# Patient Record
Sex: Female | Born: 1955
Health system: Southern US, Community
[De-identification: ages and names within clinical notes are randomized; demographics above are authoritative.]

## PROBLEM LIST (undated history)

## (undated) DIAGNOSIS — IMO0002 Reserved for concepts with insufficient information to code with codable children: Secondary | ICD-10-CM

## (undated) DIAGNOSIS — M5430 Sciatica, unspecified side: Secondary | ICD-10-CM

## (undated) HISTORY — DX: Sciatica, unspecified side: M54.30

## (undated) HISTORY — PX: AUGMENTATION MAMMAPLASTY: SUR837

## (undated) HISTORY — DX: Reserved for concepts with insufficient information to code with codable children: IMO0002

---

## 1986-07-27 HISTORY — PX: TUBAL LIGATION: SHX77

## 1998-10-03 ENCOUNTER — Other Ambulatory Visit: Admission: RE | Admit: 1998-10-03 | Discharge: 1998-10-03 | Payer: Self-pay | Admitting: Obstetrics and Gynecology

## 2000-01-16 ENCOUNTER — Encounter: Admission: RE | Admit: 2000-01-16 | Discharge: 2000-01-16 | Payer: Self-pay | Admitting: Obstetrics and Gynecology

## 2000-01-16 ENCOUNTER — Encounter: Payer: Self-pay | Admitting: Obstetrics and Gynecology

## 2001-05-09 ENCOUNTER — Other Ambulatory Visit: Admission: RE | Admit: 2001-05-09 | Discharge: 2001-05-09 | Payer: Self-pay | Admitting: *Deleted

## 2002-07-13 ENCOUNTER — Other Ambulatory Visit: Admission: RE | Admit: 2002-07-13 | Discharge: 2002-07-13 | Payer: Self-pay | Admitting: Obstetrics and Gynecology

## 2003-07-30 ENCOUNTER — Other Ambulatory Visit: Admission: RE | Admit: 2003-07-30 | Discharge: 2003-07-30 | Payer: Self-pay | Admitting: Obstetrics and Gynecology

## 2003-08-28 HISTORY — PX: BREAST SURGERY: SHX581

## 2004-02-22 ENCOUNTER — Encounter: Admission: RE | Admit: 2004-02-22 | Discharge: 2004-02-22 | Payer: Self-pay | Admitting: Obstetrics and Gynecology

## 2004-10-07 ENCOUNTER — Other Ambulatory Visit: Admission: RE | Admit: 2004-10-07 | Discharge: 2004-10-07 | Payer: Self-pay | Admitting: Obstetrics and Gynecology

## 2006-03-16 ENCOUNTER — Other Ambulatory Visit: Admission: RE | Admit: 2006-03-16 | Discharge: 2006-03-16 | Payer: Self-pay | Admitting: Obstetrics and Gynecology

## 2006-03-25 ENCOUNTER — Encounter: Admission: RE | Admit: 2006-03-25 | Discharge: 2006-03-25 | Payer: Self-pay | Admitting: Obstetrics and Gynecology

## 2006-04-06 ENCOUNTER — Encounter: Admission: RE | Admit: 2006-04-06 | Discharge: 2006-04-06 | Payer: Self-pay | Admitting: Obstetrics and Gynecology

## 2007-03-24 ENCOUNTER — Other Ambulatory Visit: Admission: RE | Admit: 2007-03-24 | Discharge: 2007-03-24 | Payer: Self-pay | Admitting: Obstetrics and Gynecology

## 2007-05-27 ENCOUNTER — Encounter: Admission: RE | Admit: 2007-05-27 | Discharge: 2007-05-27 | Payer: Self-pay | Admitting: Obstetrics and Gynecology

## 2007-10-21 ENCOUNTER — Encounter: Admission: RE | Admit: 2007-10-21 | Discharge: 2007-10-21 | Payer: Self-pay | Admitting: Obstetrics & Gynecology

## 2008-05-24 ENCOUNTER — Other Ambulatory Visit: Admission: RE | Admit: 2008-05-24 | Discharge: 2008-05-24 | Payer: Self-pay | Admitting: Obstetrics and Gynecology

## 2008-05-31 ENCOUNTER — Encounter: Admission: RE | Admit: 2008-05-31 | Discharge: 2008-05-31 | Payer: Self-pay | Admitting: Obstetrics and Gynecology

## 2008-06-12 ENCOUNTER — Encounter: Admission: RE | Admit: 2008-06-12 | Discharge: 2008-06-12 | Payer: Self-pay | Admitting: Obstetrics and Gynecology

## 2009-02-14 ENCOUNTER — Encounter: Admission: RE | Admit: 2009-02-14 | Discharge: 2009-02-14 | Payer: Self-pay | Admitting: Obstetrics and Gynecology

## 2009-08-20 ENCOUNTER — Encounter: Admission: RE | Admit: 2009-08-20 | Discharge: 2009-08-20 | Payer: Self-pay | Admitting: Obstetrics and Gynecology

## 2010-02-17 ENCOUNTER — Emergency Department (HOSPITAL_COMMUNITY): Admission: EM | Admit: 2010-02-17 | Discharge: 2010-02-17 | Payer: Self-pay | Admitting: Family Medicine

## 2011-11-24 DIAGNOSIS — R87619 Unspecified abnormal cytological findings in specimens from cervix uteri: Secondary | ICD-10-CM

## 2011-11-24 DIAGNOSIS — IMO0002 Reserved for concepts with insufficient information to code with codable children: Secondary | ICD-10-CM

## 2011-11-24 HISTORY — DX: Reserved for concepts with insufficient information to code with codable children: IMO0002

## 2011-11-24 HISTORY — DX: Unspecified abnormal cytological findings in specimens from cervix uteri: R87.619

## 2012-03-22 ENCOUNTER — Other Ambulatory Visit: Payer: Self-pay | Admitting: Obstetrics and Gynecology

## 2012-03-22 DIAGNOSIS — R922 Inconclusive mammogram: Secondary | ICD-10-CM

## 2012-03-25 ENCOUNTER — Ambulatory Visit
Admission: RE | Admit: 2012-03-25 | Discharge: 2012-03-25 | Disposition: A | Discharge status: Home / Self Care | Payer: BC Managed Care – PPO | Source: Ambulatory Visit | Attending: Obstetrics and Gynecology | Admitting: Obstetrics and Gynecology

## 2012-03-25 DIAGNOSIS — R9230 Dense breasts, unspecified: Secondary | ICD-10-CM

## 2012-03-25 DIAGNOSIS — R922 Inconclusive mammogram: Secondary | ICD-10-CM

## 2012-12-23 ENCOUNTER — Encounter: Payer: Self-pay | Admitting: *Deleted

## 2012-12-26 ENCOUNTER — Ambulatory Visit: Payer: Self-pay | Admitting: Obstetrics & Gynecology

## 2012-12-26 DIAGNOSIS — Z01419 Encounter for gynecological examination (general) (routine) without abnormal findings: Secondary | ICD-10-CM

## 2012-12-27 ENCOUNTER — Encounter: Payer: Self-pay | Admitting: Obstetrics & Gynecology

## 2013-01-11 ENCOUNTER — Ambulatory Visit: Payer: Self-pay | Admitting: Obstetrics & Gynecology

## 2013-01-12 ENCOUNTER — Encounter: Payer: Self-pay | Admitting: Obstetrics & Gynecology

## 2013-01-12 ENCOUNTER — Ambulatory Visit (INDEPENDENT_AMBULATORY_CARE_PROVIDER_SITE_OTHER): Payer: BC Managed Care – PPO | Admitting: Obstetrics & Gynecology

## 2013-01-12 VITALS — BP 102/64 | HR 64 | Resp 12 | Ht 67.0 in | Wt 123.2 lb

## 2013-01-12 DIAGNOSIS — Z01419 Encounter for gynecological examination (general) (routine) without abnormal findings: Secondary | ICD-10-CM

## 2013-01-12 DIAGNOSIS — Z124 Encounter for screening for malignant neoplasm of cervix: Secondary | ICD-10-CM

## 2013-01-12 DIAGNOSIS — Z0000 Encounter for general adult medical examination without abnormal findings: Secondary | ICD-10-CM

## 2013-01-12 DIAGNOSIS — R87619 Unspecified abnormal cytological findings in specimens from cervix uteri: Secondary | ICD-10-CM

## 2013-01-12 LAB — HEMOGLOBIN, FINGERSTICK: Hemoglobin, fingerstick: 12.6 g/dL (ref 12.0–16.0)

## 2013-01-12 MED ORDER — NITROFURANTOIN MACROCRYSTAL 100 MG PO CAPS: 100.0000 mg | ORAL_CAPSULE | Freq: Every day | ORAL | Status: DC

## 2013-01-12 NOTE — Patient Instructions (Signed)

## 2013-01-12 NOTE — Progress Notes (Signed)
57 y.o. Sandra Young MarriedCaucasianF here for annual exam.  Oldest daughter is getting married this fall.  Lots of stress related to this.  Daughter lives in [place].  Going to get married at [facility].  Has lost 8 pounds.  This is due to back pain and her change in exercise/diet.    Patient's last menstrual period was 03/27/2012.          Sexually active: yes  The current method of family planning is tubal ligation.    Exercising: yes  aerobics, pilates, yoga, and weights Smoker:  no  Health Maintenance: Pap:  06/28/12 ASCUS History of abnormal Pap:  Yes ASCUS/AGUS/negative HR HPV 2013, normal ECC, endometrial biopy neg MMG:  03/25/12 normal  Colonoscopy:  1/10 repeat in 10 years  BMD:   8/07 normal  TDaP:  8/08 Screening Labs: 2/12--done here, Hb today: 12.6, Urine today: WBC-trace   reports that she has never smoked. She has never used smokeless tobacco. She reports that she drinks about 0.5 ounces of alcohol per week. She reports that she does not use illicit drugs.  Past Medical History  Diagnosis Date  . Abnormal Pap smear 11/24/11    ASCUS/AGUS/negative HR HPV  . Sciatic nerve pain     Past Surgical History  Procedure Laterality Date  . Breast surgery Bilateral 08/2003    Saline implants  . Tubal ligation      Current Outpatient Prescriptions  Medication Sig Dispense Refill  . Biotin 1000 MCG tablet Take 1,000 mcg by mouth 3 (three) times daily.      Marland Kitchen BLACK COHOSH PO Take by mouth.      . Cholecalciferol (VITAMIN D) 2000 UNITS CAPS Take by mouth.      Marland Kitchen ibuprofen (ADVIL,MOTRIN) 200 MG tablet Take 200 mg by mouth every 6 (six) hours as needed for pain.      . nitrofurantoin (MACRODANTIN) 100 MG capsule Take 100 mg by mouth 4 (four) times daily. PRN       No current facility-administered medications for this visit.    Family History  Problem Relation Age of Onset  . Breast cancer Mother 29  . Cancer Father 62    Lung    ROS:  Pertinent items are noted in HPI.   Otherwise, a comprehensive ROS was negative.  Exam:   BP 102/64  Pulse 64  Resp 12  Ht 5\' 7"  (1.702 m)  Wt 123 lb 3.2 oz (55.883 kg)  BMI 19.29 kg/m2  LMP 03/27/2012  Weight change: -8lbs  Height: 5\' 7"  (170.2 cm)  Ht Readings from Last 3 Encounters:  01/12/13 5\' 7"  (1.702 m)    General appearance: alert, cooperative and appears stated age Head: Normocephalic, without obvious abnormality, atraumatic Neck: no adenopathy, supple, symmetrical, trachea midline and thyroid normal to inspection and palpation Lungs: clear to auscultation bilaterally Breasts: normal appearance, no masses or tenderness, bilateral and very mobile implants Heart: regular rate and rhythm Abdomen: soft, non-tender; bowel sounds normal; no masses,  no organomegaly Extremities: extremities normal, atraumatic, no cyanosis or edema Skin: Skin color, texture, turgor normal. No rashes or lesions Lymph nodes: Cervical, supraclavicular, and axillary nodes normal. No abnormal inguinal nodes palpated Neurologic: Grossly normal   Pelvic: External genitalia:  no lesions              Urethra:  normal appearing urethra with no masses, tenderness or lesions              Bartholins and Skenes: normal  Vagina: normal appearing vagina with normal color and discharge, no lesions              Cervix: no lesions              Pap taken: yes Bimanual Exam:  Uterus:  normal size, contour, position, consistency, mobility, non-tender and retroflexed              Adnexa: normal adnexa and no mass, fullness, tenderness               Rectovaginal: Confirms               Anus:  normal sphincter tone, no lesions  A:  Well Woman with normal exam PMP, No HRT Recent family stressors with upcoming wedding of daughter H/O recurrent UTIs, uses suppression only sporatically Vit D deficiency H/O AGUS/ASCUS pap 4/13 with neg evaluation thus far  P:   Mammogram yearly.  D/W pt 3D MMG pap smear today with HR HPV.  If  negative, will need cotest 1 year. return annually or prn  An After Visit Summary was printed and given to the patient.

## 2013-06-29 ENCOUNTER — Ambulatory Visit: Payer: BC Managed Care – PPO | Admitting: Obstetrics & Gynecology

## 2013-07-13 ENCOUNTER — Ambulatory Visit (INDEPENDENT_AMBULATORY_CARE_PROVIDER_SITE_OTHER): Payer: BC Managed Care – PPO | Admitting: Obstetrics & Gynecology

## 2013-07-13 ENCOUNTER — Encounter: Payer: Self-pay | Admitting: Obstetrics & Gynecology

## 2013-07-13 VITALS — BP 100/60 | HR 64 | Resp 16 | Ht 67.0 in | Wt 125.0 lb

## 2013-07-13 DIAGNOSIS — IMO0002 Reserved for concepts with insufficient information to code with codable children: Secondary | ICD-10-CM

## 2013-07-13 DIAGNOSIS — R87619 Unspecified abnormal cytological findings in specimens from cervix uteri: Secondary | ICD-10-CM

## 2013-07-13 NOTE — Progress Notes (Signed)
57 y.o. Sandra Young MarriedCaucasianF here for repeat Pap smear due to history of ascus/agus 4/13.  She has undergone evaluation in the past including colposcopy with ECC and endometrial biopsy.  No abnormalities were seen.  ECC was negative.  Endometrial biopsy showed proliferative endometrium.  HR HPV testing was negative as well.  Follow-up pap smears are as follows:  12/13 ASCUS, 6/14 ASCUS.  Today she does not have vaginal symptoms or STD concerns.  LMP 03/27/12.  Contraception:PMP.  EXAM: BP 100/60  Pulse 64  Resp 16  Ht 5\' 7"  (1.702 m)  Wt 125 lb (56.7 kg)  BMI 19.57 kg/m2 General appearance:  WNWD Female, NAD Pelvic exam: normal external genitalia, vulva, vagina, cervix, uterus and adnexa. Pap smear obtained.  Assessment: H/O ASCUS/AGUS pap with neg HR HPV 4/13.  Today is 3rd of 4th follow up Paps  Plan: If normal, repeat at [facility].  If abnormal, will manage appropriately.

## 2013-07-28 ENCOUNTER — Telehealth: Payer: Self-pay

## 2013-07-28 NOTE — Telephone Encounter (Signed)
lmtcb

## 2013-07-28 NOTE — Telephone Encounter (Signed)
Message copied by Elisha HeadlandNIX, Saban Heinlen S on Fri Jul 28, 2013  9:14 AM ------      Message from: Jerene BearsMILLER, MARY S      Created: Wed Jul 26, 2013  7:33 AM       Inform pap normal.  Out of current recall.  Needs repeat Pap at [facility].  Should be in summer.  Last AEX was 6/14.  Will return to normal screening after that Pap.  Needs new recall as well. ------

## 2013-08-17 NOTE — Telephone Encounter (Signed)
Patient notified. In 08 recall 12/15, left message for patient to call back to schedule this appointment.

## 2013-12-05 ENCOUNTER — Ambulatory Visit (INDEPENDENT_AMBULATORY_CARE_PROVIDER_SITE_OTHER): Payer: BC Managed Care – PPO | Admitting: Nurse Practitioner

## 2013-12-05 ENCOUNTER — Encounter: Payer: Self-pay | Admitting: Nurse Practitioner

## 2013-12-05 VITALS — BP 92/64 | HR 68 | Temp 98.1°F | Ht 67.0 in | Wt 123.0 lb

## 2013-12-05 DIAGNOSIS — R300 Dysuria: Secondary | ICD-10-CM

## 2013-12-05 MED ORDER — CIPROFLOXACIN HCL 500 MG PO TABS: 500.0000 mg | ORAL_TABLET | Freq: Two times a day (BID) | ORAL | Status: DC

## 2013-12-05 MED ORDER — PHENAZOPYRIDINE HCL 200 MG PO TABS: 200.0000 mg | ORAL_TABLET | Freq: Three times a day (TID) | ORAL | Status: DC | PRN

## 2013-12-05 NOTE — Patient Instructions (Signed)
Urinary Tract Infection  Urinary tract infections (UTIs) can develop anywhere along your urinary tract. Your urinary tract is your body's drainage system for removing wastes and extra water. Your urinary tract includes two kidneys, two ureters, a bladder, and a urethra. Your kidneys are a pair of bean-shaped organs. Each kidney is about the size of your fist. They are located below your ribs, one on each side of your spine.  CAUSES  Infections are caused by microbes, which are microscopic organisms, including fungi, viruses, and bacteria. These organisms are so small that they can only be seen through a microscope. Bacteria are the microbes that most commonly cause UTIs.  SYMPTOMS   Symptoms of UTIs may vary by age and gender of the patient and by the location of the infection. Symptoms in young women typically include a frequent and intense urge to urinate and a painful, burning feeling in the bladder or urethra during urination. Older women and men are more likely to be tired, shaky, and weak and have muscle aches and abdominal pain. A fever may mean the infection is in your kidneys. Other symptoms of a kidney infection include pain in your back or sides below the ribs, nausea, and vomiting.  DIAGNOSIS  To diagnose a UTI, your caregiver will ask you about your symptoms. Your caregiver also will ask to provide a urine sample. The urine sample will be tested for bacteria and white blood cells. White blood cells are made by your body to help fight infection.  TREATMENT   Typically, UTIs can be treated with medication. Because most UTIs are caused by a bacterial infection, they usually can be treated with the use of antibiotics. The choice of antibiotic and length of treatment depend on your symptoms and the type of bacteria causing your infection.  HOME CARE INSTRUCTIONS   If you were prescribed antibiotics, take them exactly as your caregiver instructs you. Finish the medication even if you feel better after you  have only taken some of the medication.   Drink enough water and fluids to keep your urine clear or pale yellow.   Avoid caffeine, tea, and carbonated beverages. They tend to irritate your bladder.   Empty your bladder often. Avoid holding urine for long periods of time.   Empty your bladder before and after sexual intercourse.   After a bowel movement, women should cleanse from front to back. Use each tissue only once.  SEEK MEDICAL CARE IF:    You have back pain.   You develop a fever.   Your symptoms do not begin to resolve within 3 days.  SEEK IMMEDIATE MEDICAL CARE IF:    You have severe back pain or lower abdominal pain.   You develop chills.   You have nausea or vomiting.   You have continued burning or discomfort with urination.  MAKE SURE YOU:    Understand these instructions.   Will watch your condition.   Will get help right away if you are not doing well or get worse.  Document Released: 04/22/2005 Document Revised: 01/12/2012 Document Reviewed: 08/21/2011  ExitCare Patient Information 2014 [employer].

## 2013-12-05 NOTE — Progress Notes (Signed)
S:   58 y.o.Married Caucasian female presents with complaint of UTI. Symptoms began about 2 weeks ago. With symptoms of dysuria, urinary frequency, urinary urgency, lower pelvic aching and nausea. .  She has Macrobid at home to use post coital, so started at BID dosing for a week.  No help with symptoms. Last dose yesterday.  Denies vaginal symptoms.   Pertinent negatives include:  no constitutional symptoms, denying fever, chills, anorexia, or weight loss.. Sexually active yes  Symptoms not related to post coital.  These symptoms seemed to have started after taking Advil cold and sinus med's.   Menopausal with some  vaginal dryness.  Same partner without change. Last UTI documented:  1-2 years ago.    ROS:  no weight loss, fever, night sweats, feels OK; she does feel fatigued, generally weak and loss of appetite  O: General: alert, oriented to person, place, and time   thin, healthy and  alert  Abdomen:  Mild suprapubic pressure  No CVA tenderness  Pelvic:  deferred   Diagnostic Test:    Urinalysis: took AZO unable to read   urine culture  Assessment:  R/O UTI    Postmenopausal   Plan:  Medications: ciprofloxacin. Maintain adequate hydration. Follow up if symptoms not improving, and as needed.   Medication Therapy: 500 mg two times daily   Lab:TOC if Urine Culture is positive

## 2013-12-06 LAB — URINALYSIS, MICROSCOPIC ONLY
Bacteria, UA: NONE SEEN
CASTS: NONE SEEN
CRYSTALS: NONE SEEN
SQUAMOUS EPITHELIAL / LPF: NONE SEEN

## 2013-12-07 LAB — URINE CULTURE: Colony Count: >=100000

## 2013-12-10 NOTE — Progress Notes (Signed)
Encounter reviewed by Dr. Landen Breeland Silva.  

## 2014-01-16 ENCOUNTER — Ambulatory Visit: Payer: BC Managed Care – PPO | Admitting: Obstetrics & Gynecology

## 2014-03-14 ENCOUNTER — Other Ambulatory Visit: Payer: Self-pay | Admitting: Obstetrics & Gynecology

## 2014-03-14 NOTE — Telephone Encounter (Signed)
Last AEX and refill 01/12/14 #30/5 R No future appt  Please advise.

## 2014-04-05 ENCOUNTER — Encounter: Payer: Self-pay | Admitting: Nurse Practitioner

## 2014-04-05 ENCOUNTER — Ambulatory Visit (INDEPENDENT_AMBULATORY_CARE_PROVIDER_SITE_OTHER): Payer: BC Managed Care – PPO | Admitting: Nurse Practitioner

## 2014-04-05 VITALS — BP 110/80 | HR 60 | Temp 98.1°F | Ht 67.0 in | Wt 123.0 lb

## 2014-04-05 DIAGNOSIS — R3915 Urgency of urination: Secondary | ICD-10-CM

## 2014-04-05 LAB — POCT URINALYSIS DIPSTICK
Bilirubin, UA: NEGATIVE
Blood, UA: NEGATIVE
Glucose, UA: NEGATIVE
Ketones, UA: NEGATIVE
LEUKOCYTES UA: NEGATIVE
NITRITE UA: NEGATIVE
Protein, UA: NEGATIVE
Urobilinogen, UA: NEGATIVE
pH, UA: 5

## 2014-04-05 MED ORDER — NITROFURANTOIN MACROCRYSTAL 100 MG PO CAPS: ORAL_CAPSULE | ORAL | Status: DC

## 2014-04-05 MED ORDER — CIPROFLOXACIN HCL 500 MG PO TABS: 500.0000 mg | ORAL_TABLET | Freq: Two times a day (BID) | ORAL | Status: DC

## 2014-04-05 MED ORDER — ZOLPIDEM TARTRATE 10 MG PO TABS: 10.0000 mg | ORAL_TABLET | Freq: Every evening | ORAL | Status: DC | PRN

## 2014-04-05 NOTE — Patient Instructions (Signed)

## 2014-04-05 NOTE — Progress Notes (Signed)
Subjective:     Patient ID: Sandra Young, female   DOB: 1955-08-24, 58 y.o.   MRN: [id number]  Urinary Tract Infection  Associated symptoms include frequency and urgency. Pertinent negatives include no chills or flank pain.   This 58 yo female complains of UTI symptoms started 3 days ago. Out of prophylactic Macrobid for about 3 months.  Planning a wedding next Friday.  No fever or chills. No vaginal discharge. Maybe related to SA on Wednesday.  But recent cold and sinus Advil med's that also causes these symptoms.  Review of Systems  Constitutional: Negative for fever, chills and fatigue.  Gastrointestinal: Negative.   Genitourinary: Positive for urgency and frequency. Negative for dysuria, flank pain, vaginal bleeding, vaginal discharge, vaginal pain, pelvic pain and dyspareunia.  Musculoskeletal: Negative.   Skin: Negative.   Neurological: Negative.   Psychiatric/Behavioral: Negative.        Objective:   Physical Exam  Constitutional: She is oriented to person, place, and time. She appears well-developed and well-nourished. No distress.  Abdominal: Soft. She exhibits no distension. There is tenderness.  Slight tender over bladder  Neurological: She is alert and oriented to person, place, and time.  Psychiatric: She has a normal mood and affect. Her behavior is normal. Judgment and thought content normal.       Assessment:    R/O UTI Recent insomnia with planning wedding and travel    Plan:     Cipro 500 mg BID # 14 Refill Macrobid for prophylactic use prn after SA - not to be used for now Refill on Ambien 10 mg to use only prn for right now with travel and daughter's upcoming wedding. Returning for AEX

## 2014-04-07 LAB — URINE CULTURE

## 2014-04-08 NOTE — Progress Notes (Signed)
Encounter reviewed by Dr. Brook Silva.  

## 2014-05-28 ENCOUNTER — Encounter: Payer: Self-pay | Admitting: Nurse Practitioner

## 2014-06-26 ENCOUNTER — Telehealth: Payer: Self-pay | Admitting: Nurse Practitioner

## 2014-06-26 DIAGNOSIS — Z0000 Encounter for general adult medical examination without abnormal findings: Secondary | ICD-10-CM

## 2014-06-26 NOTE — Telephone Encounter (Signed)
Spoke with patient. Patient states that she has not had "a full lab work up" in a couple of years. "I see your office as my primary. I was hoping Dr.Miller could check all my labs before my annual." Advised patient will send a message to Dr.Miller for review and order for labs. Will return call to patient for scheduling. Patient is agreeable.

## 2014-06-26 NOTE — Telephone Encounter (Signed)
Patient is requesting to come in this month for blood work. Patient has an aex appointment 07/03/14 and is asking to come in earlier for labs.

## 2014-06-27 NOTE — Telephone Encounter (Signed)
Left message to call Alyha Marines at [phone]. 

## 2014-06-27 NOTE — Telephone Encounter (Signed)
Orders placed for labs.  She can come any day between now and appt.  Please make sure no copay for this as will be part of AEX.  Thanks.  Checking TSH, CMP, Vit D, and Lipids.  Does need to be fasting.

## 2014-06-27 NOTE — Telephone Encounter (Signed)
Spoke with patient. Advised patient of message as seen below from Dr.Miller. Patient is agreeable. Offered patient lab appointment early this week for fasting labs. Patient states that she would like to have lab work done the morning of her aex as she took the day off work. Lab appointment scheduled for 10am on 12/8. Patient is agreeable to date and time.  Routing to provider for final review. Patient agreeable to disposition. Will close encounter

## 2014-07-03 ENCOUNTER — Encounter: Payer: Self-pay | Admitting: Nurse Practitioner

## 2014-07-03 ENCOUNTER — Other Ambulatory Visit (INDEPENDENT_AMBULATORY_CARE_PROVIDER_SITE_OTHER): Payer: BC Managed Care – PPO

## 2014-07-03 ENCOUNTER — Ambulatory Visit (INDEPENDENT_AMBULATORY_CARE_PROVIDER_SITE_OTHER): Payer: BC Managed Care – PPO | Admitting: Nurse Practitioner

## 2014-07-03 VITALS — BP 122/82 | HR 68 | Resp 16 | Ht 66.5 in | Wt 124.0 lb

## 2014-07-03 DIAGNOSIS — Z1211 Encounter for screening for malignant neoplasm of colon: Secondary | ICD-10-CM

## 2014-07-03 DIAGNOSIS — Z0000 Encounter for general adult medical examination without abnormal findings: Secondary | ICD-10-CM

## 2014-07-03 DIAGNOSIS — Z01419 Encounter for gynecological examination (general) (routine) without abnormal findings: Secondary | ICD-10-CM

## 2014-07-03 NOTE — Patient Instructions (Signed)

## 2014-07-03 NOTE — Progress Notes (Signed)
58 y.o. 684P4 Married Caucasian Fe here for annual exam.  No vaso symptoms.  Had URI symptoms about 2 months ago.  About the same time of taking a lot of decongestants, she felt dehydrated. She had an episode of fatigue, malaise, and at times sad.   Also had been off Vit D for some time and went back on 5000 Vit D daily. Energy level is better.  But still feels 'sad' at times.  Does not relate this to her daughter getting married in September.  No other family stressors.  Patient's last menstrual period was 11/20/2011.          Sexually active: Yes.    The current method of family planning is tubal ligation.    Exercising: Yes.    Weights, cardio, yoga 6 - 7 days weekly Smoker:  no  Health Maintenance: Pap:  06/2013 Neg MMG:  02/2012 BIRADS1: Neg Colonoscopy: 07/2008 Normal- repeat 10 years  BMD:   2007 TDaP:  2008 Labs: will get done today,  Declined urine   reports that she has never smoked. She has never used smokeless tobacco. She reports that she drinks about 2.4 - 3.0 oz of alcohol per week. She reports that she does not use illicit drugs.  Past Medical History  Diagnosis Date  . Abnormal Pap smear 11/24/11    ASCUS/AGUS/negative HR HPV  . Sciatic nerve pain     Past Surgical History  Procedure Laterality Date  . Breast surgery Bilateral 08/2003    Saline implants  . Tubal ligation  1988    Current Outpatient Prescriptions  Medication Sig Dispense Refill  . Biotin 1000 MCG tablet Take 1,000 mcg by mouth 3 (three) times daily.    . Cholecalciferol (VITAMIN D) 2000 UNITS CAPS Take by mouth.    Marland Kitchen. ibuprofen (ADVIL,MOTRIN) 200 MG tablet Take 200 mg by mouth every 6 (six) hours as needed for pain.    . nitrofurantoin (MACRODANTIN) 100 MG capsule TAKE ONE CAPSULE BY MOUTH ONCE DAILY AS NEEDED. 30 capsule 3  . ciprofloxacin (CIPRO) 500 MG tablet Take 1 tablet (500 mg total) by mouth 2 (two) times daily. (Patient not taking: Reported on 07/03/2014) 14 tablet 0  . zolpidem (AMBIEN) 10  MG tablet Take 1 tablet (10 mg total) by mouth at bedtime as needed for sleep. 30 tablet 0   No current facility-administered medications for this visit.    Family History  Problem Relation Age of Onset  . Breast cancer Mother [phone]  . Cancer Father [id number]    Lung    ROS:  Pertinent items are noted in HPI.  Otherwise, a comprehensive ROS was negative.  Exam:   BP 122/82 mmHg  Pulse 68  Resp 16  Ht 5' 6.5" (1.689 m)  Wt 124 lb (56.246 kg)  BMI 19.72 kg/m2  LMP 11/20/2011 Height: 5' 6.5" (168.9 cm)  Ht Readings from Last 3 Encounters:  07/03/14 5' 6.5" (1.689 m)  04/05/14 5\' 7"  (1.702 m)  12/05/13 5\' 7"  (1.702 m)    General appearance: alert, cooperative and appears stated age Head: Normocephalic, without obvious abnormality, atraumatic Neck: no adenopathy, supple, symmetrical, trachea midline and thyroid normal to inspection and palpation Lungs: clear to auscultation bilaterally Breasts: normal appearance, no masses or tenderness, positive findings: implants bilaterally Heart: regular rate and rhythm Abdomen: soft, non-tender; no masses,  no organomegaly Extremities: extremities normal, atraumatic, no cyanosis or edema Skin: Skin color, texture, turgor normal. No rashes or lesions Lymph nodes: Cervical, supraclavicular, and  axillary nodes normal. No abnormal inguinal nodes palpated Neurologic: Grossly normal   Pelvic: External genitalia:  no lesions              Urethra:  normal appearing urethra with no masses, tenderness or lesions              Bartholin's and Skene's: normal                 Vagina: normal appearing vagina with normal color and discharge, no lesions              Cervix: anteverted              Pap taken: Yes.   Bimanual Exam:  Uterus:  normal size, contour, position, consistency, mobility, non-tender              Adnexa: no mass, fullness, tenderness               Rectovaginal: Confirms               Anus:  normal sphincter tone, no lesions  A:  Well  Woman with normal exam  Postmenopausal, No HRT  H/O recurrent UTIs, uses suppression only sporadically  Vit D deficiency  H/O AGUS/ASCUS pap 4/13 with neg evaluation   Fatigue and sad feelings - ? etiology   P:   Reviewed health and wellness pertinent to exam  Pap smear taken today  Mammogram is past due and will schedule  IFOB is given  Supportive measures  Will follow with labs  Counseled on breast self exam, mammography screening, adequate intake of calcium and vitamin D, diet and exercise, Kegel's exercises return annually or prn  An After Visit Summary was printed and given to the patient.

## 2014-07-04 LAB — CBC WITH DIFFERENTIAL/PLATELET
BASOS PCT: 1 % (ref 0–1)
Basophils Absolute: 0.1 10*3/uL (ref 0.0–0.1)
EOS ABS: 0.1 10*3/uL (ref 0.0–0.7)
Eosinophils Relative: 1 % (ref 0–5)
HEMATOCRIT: 40.6 % (ref 36.0–46.0)
Hemoglobin: 13.8 g/dL (ref 12.0–15.0)
Lymphocytes Relative: 27 % (ref 12–46)
Lymphs Abs: 1.5 10*3/uL (ref 0.7–4.0)
MCH: 30.9 pg (ref 26.0–34.0)
MCHC: 34 g/dL (ref 30.0–36.0)
MCV: 91 fL (ref 78.0–100.0)
MONOS PCT: 9 % (ref 3–12)
MPV: 9.6 fL (ref 9.4–12.4)
Monocytes Absolute: 0.5 10*3/uL (ref 0.1–1.0)
NEUTROS ABS: 3.3 10*3/uL (ref 1.7–7.7)
Neutrophils Relative %: 62 % (ref 43–77)
Platelets: 228 10*3/uL (ref 150–400)
RBC: 4.46 MIL/uL (ref 3.87–5.11)
RDW: 13.4 % (ref 11.5–15.5)
WBC: 5.4 10*3/uL (ref 4.0–10.5)

## 2014-07-04 LAB — LIPID PANEL
Cholesterol: 213 mg/dL — ABNORMAL HIGH (ref 0–200)
HDL: 115 mg/dL (ref >39)
LDL Cholesterol: 87 mg/dL (ref 0–99)
TRIGLYCERIDES: 56 mg/dL (ref <150)
Total CHOL/HDL Ratio: 1.9 Ratio
VLDL: 11 mg/dL (ref 0–40)

## 2014-07-04 LAB — COMPREHENSIVE METABOLIC PANEL
ALK PHOS: 57 U/L (ref 39–117)
ALT: 18 U/L (ref 0–35)
AST: 21 U/L (ref 0–37)
Albumin: 4.8 g/dL (ref 3.5–5.2)
BUN: 16 mg/dL (ref 6–23)
CALCIUM: 9.9 mg/dL (ref 8.4–10.5)
CO2: 28 meq/L (ref 19–32)
CREATININE: 0.79 mg/dL (ref 0.50–1.10)
Chloride: 99 mEq/L (ref 96–112)
GLUCOSE: 101 mg/dL — AB (ref 70–99)
Potassium: 4.1 mEq/L (ref 3.5–5.3)
Sodium: 138 mEq/L (ref 135–145)
Total Bilirubin: 0.6 mg/dL (ref 0.2–1.2)
Total Protein: 7 g/dL (ref 6.0–8.3)

## 2014-07-04 LAB — TSH: TSH: 0.864 u[IU]/mL (ref 0.350–4.500)

## 2014-07-04 LAB — VITAMIN D 25 HYDROXY (VIT D DEFICIENCY, FRACTURES): VIT D 25 HYDROXY: 131 ng/mL — AB (ref 30–100)

## 2014-07-05 LAB — IPS PAP TEST WITH HPV

## 2014-07-06 ENCOUNTER — Other Ambulatory Visit: Payer: Self-pay | Admitting: Nurse Practitioner

## 2014-07-06 DIAGNOSIS — Z1231 Encounter for screening mammogram for malignant neoplasm of breast: Secondary | ICD-10-CM

## 2014-07-08 NOTE — Progress Notes (Signed)
Encounter reviewed by Dr. Lochlan Grygiel Silva.  

## 2014-07-13 ENCOUNTER — Ambulatory Visit: Payer: BC Managed Care – PPO | Admitting: Nurse Practitioner

## 2014-07-25 LAB — FECAL OCCULT BLOOD, IMMUNOCHEMICAL: Fecal Occult Blood: NEGATIVE

## 2014-08-01 ENCOUNTER — Ambulatory Visit
Admission: RE | Admit: 2014-08-01 | Discharge: 2014-08-01 | Disposition: A | Discharge status: Home / Self Care | Payer: BLUE CROSS/BLUE SHIELD | Source: Ambulatory Visit | Attending: Nurse Practitioner | Admitting: Nurse Practitioner

## 2014-08-01 DIAGNOSIS — Z1231 Encounter for screening mammogram for malignant neoplasm of breast: Secondary | ICD-10-CM

## 2014-08-03 ENCOUNTER — Other Ambulatory Visit: Payer: Self-pay | Admitting: Nurse Practitioner

## 2014-08-03 DIAGNOSIS — R928 Other abnormal and inconclusive findings on diagnostic imaging of breast: Secondary | ICD-10-CM

## 2014-08-07 ENCOUNTER — Ambulatory Visit
Admission: RE | Admit: 2014-08-07 | Discharge: 2014-08-07 | Disposition: A | Discharge status: Home / Self Care | Payer: BLUE CROSS/BLUE SHIELD | Source: Ambulatory Visit | Attending: Nurse Practitioner | Admitting: Nurse Practitioner

## 2014-08-07 DIAGNOSIS — R928 Other abnormal and inconclusive findings on diagnostic imaging of breast: Secondary | ICD-10-CM

## 2014-08-15 ENCOUNTER — Other Ambulatory Visit: Payer: BLUE CROSS/BLUE SHIELD

## 2015-07-08 ENCOUNTER — Encounter: Payer: Self-pay | Admitting: Nurse Practitioner

## 2015-07-08 ENCOUNTER — Ambulatory Visit (INDEPENDENT_AMBULATORY_CARE_PROVIDER_SITE_OTHER): Payer: BLUE CROSS/BLUE SHIELD | Admitting: Nurse Practitioner

## 2015-07-08 VITALS — BP 110/72 | HR 64 | Ht 66.25 in | Wt 126.0 lb

## 2015-07-08 DIAGNOSIS — Z01419 Encounter for gynecological examination (general) (routine) without abnormal findings: Secondary | ICD-10-CM

## 2015-07-08 DIAGNOSIS — N3001 Acute cystitis with hematuria: Secondary | ICD-10-CM | POA: Diagnosis not present

## 2015-07-08 DIAGNOSIS — Z1211 Encounter for screening for malignant neoplasm of colon: Secondary | ICD-10-CM | POA: Diagnosis not present

## 2015-07-08 DIAGNOSIS — Z0000 Encounter for general adult medical examination without abnormal findings: Secondary | ICD-10-CM

## 2015-07-08 LAB — POCT URINALYSIS DIPSTICK
BILIRUBIN UA: NEGATIVE
Glucose, UA: NEGATIVE
Ketones, UA: NEGATIVE
NITRITE UA: POSITIVE
Protein, UA: NEGATIVE
Urobilinogen, UA: NEGATIVE
pH, UA: 5

## 2015-07-08 MED ORDER — NITROFURANTOIN MACROCRYSTAL 100 MG PO CAPS: ORAL_CAPSULE | ORAL | Status: DC

## 2015-07-08 MED ORDER — CIPROFLOXACIN HCL 500 MG PO TABS: 500.0000 mg | ORAL_TABLET | Freq: Two times a day (BID) | ORAL | Status: DC

## 2015-07-08 NOTE — Patient Instructions (Signed)

## 2015-07-08 NOTE — Progress Notes (Signed)
Patient ID: Sandra Young, female   DOB: 09-14-1955, 59 y.o.   MRN: [id number]  59 y.o. G4P0004 Married  Caucasian Fe here for annual exam.  No new diagnosis this past year.  Patient's last menstrual period was 04/26/2012 (approximate).          Sexually active: Yes.    The current method of family planning is post menopausal status.    Exercising: Yes.    cardio, yoga and pilates.  Pt is also exercise instructor. Smoker:  no  Health Maintenance: Pap:07/03/14, Negative with neg HR HPV MMG:08/01/14 3D with Implants with Diagnostic Left Mammogram and Ultrasound 08/07/14, Bi-Rads 1: Negative  Colonoscopy: 07/2008 Normal- repeat 10 years  BMD:03/26/06 (age 59) Z Score 1.2 Spine / 0.3 Left Femur Total TDaP: 02/2007  Labs:  Will return for fasting labs  Urine: + nitrates, 2+ leuk's , trace RBC   reports that she has never smoked. She has never used smokeless tobacco. She reports that she drinks about 2.4 - 3.0 oz of alcohol per week. She reports that she does not use illicit drugs.  Past Medical History  Diagnosis Date  . Abnormal Pap smear 11/24/11    ASCUS/AGUS/negative HR HPV  . Sciatic nerve pain     Past Surgical History  Procedure Laterality Date  . Breast surgery Bilateral 08/2003    Saline implants  . Tubal ligation  1988    Current Outpatient Prescriptions  Medication Sig Dispense Refill  . Biotin 1000 MCG tablet Take 1,000 mcg by mouth 3 (three) times daily.    . Cholecalciferol (VITAMIN D) 2000 UNITS CAPS Take by mouth.    Marland Kitchen ibuprofen (ADVIL,MOTRIN) 200 MG tablet Take 200 mg by mouth every 6 (six) hours as needed for pain.    . nitrofurantoin (MACRODANTIN) 100 MG capsule TAKE ONE CAPSULE BY MOUTH ONCE DAILY AS NEEDED. 30 capsule 3   No current facility-administered medications for this visit.    Family History  Problem Relation Age of Onset  . Breast cancer Mother 39  . Cancer Father 32    Lung    ROS:  Pertinent items are noted in HPI.  Otherwise, a  comprehensive ROS was negative.  Exam:   BP 110/72 mmHg  Pulse 64  Ht 5' 6.25" (1.683 m)  Wt 126 lb (57.153 kg)  BMI 20.18 kg/m2  LMP 04/26/2012 (Approximate) Height: 5' 6.25" (168.3 cm) Ht Readings from Last 3 Encounters:  07/08/15 5' 6.25" (1.683 m)  07/03/14 5' 6.5" (1.689 m)  04/05/14  (1.702 m)    General appearance: alert, cooperative and appears stated age Head: Normocephalic, without obvious abnormality, atraumatic Neck: no adenopathy, supple, symmetrical, trachea midline and thyroid normal to inspection and palpation Lungs: clear to auscultation bilaterally Breasts: normal appearance, no masses or tenderness Heart: regular rate and rhythm Abdomen: soft, non-tender; no masses,  no organomegaly Extremities: extremities normal, atraumatic, no cyanosis or edema Skin: Skin color, texture, turgor normal. No rashes or lesions Lymph nodes: Cervical, supraclavicular, and axillary nodes normal. No abnormal inguinal nodes palpated Neurologic: Grossly normal   Pelvic: External genitalia:  no lesions              Urethra:  normal appearing urethra with no masses, tenderness or lesions              Bartholin's and Skene's: normal                 Vagina: normal appearing vagina with normal color and discharge, no lesions  Cervix: anteverted              Pap taken: No. Bimanual Exam:  Uterus:  normal size, contour, position, consistency, mobility, non-tender              Adnexa: no mass, fullness, tenderness               Rectovaginal: Confirms               Anus:  normal sphincter tone, no lesions  Chaperone present: no  A:  Well Woman with normal exam  Postmenopausal, No HRT current UTI with H/O recurrent UTIs, uses suppression only sporadically Vit D deficiency H/O AGUS/ASCUS pap 4/13 with neg evaluation  Fatigue and anxiuos - 3 new grand children are coming:  2 in January and 1 in March   P:   Reviewed  health and wellness pertinent to exam  Pap smear as above  Mammogram is due 07/2015  Refill on Macrodantin to use as prn post coital  Will follow with labs  IFOB is given  Counseled on breast self exam, mammography screening, adequate intake of calcium and vitamin D, diet and exercise return annually or prn  An After Visit Summary was printed and given to the patient.

## 2015-07-10 LAB — URINE CULTURE: Colony Count: >=100000

## 2015-07-10 NOTE — Progress Notes (Signed)
Encounter reviewed Jill Jertson, MD   

## 2015-07-12 ENCOUNTER — Other Ambulatory Visit (INDEPENDENT_AMBULATORY_CARE_PROVIDER_SITE_OTHER): Payer: BLUE CROSS/BLUE SHIELD

## 2015-07-12 DIAGNOSIS — Z0000 Encounter for general adult medical examination without abnormal findings: Secondary | ICD-10-CM

## 2015-07-12 LAB — LIPID PANEL
CHOL/HDL RATIO: 1.7 ratio (ref <=5.0)
Cholesterol: 183 mg/dL (ref 125–200)
HDL: 109 mg/dL (ref >=46)
LDL CALC: 63 mg/dL (ref <130)
Triglycerides: 55 mg/dL (ref <150)
VLDL: 11 mg/dL (ref <30)

## 2015-07-12 LAB — CBC WITH DIFFERENTIAL/PLATELET
Basophils Absolute: 0 10*3/uL (ref 0.0–0.1)
Basophils Relative: 1 % (ref 0–1)
EOS PCT: 2 % (ref 0–5)
Eosinophils Absolute: 0.1 10*3/uL (ref 0.0–0.7)
HEMATOCRIT: 39.8 % (ref 36.0–46.0)
Hemoglobin: 14.4 g/dL (ref 12.0–15.0)
Lymphocytes Relative: 35 % (ref 12–46)
Lymphs Abs: 1.4 10*3/uL (ref 0.7–4.0)
MCH: 32.2 pg (ref 26.0–34.0)
MCHC: 36.2 g/dL — ABNORMAL HIGH (ref 30.0–36.0)
MCV: 89 fL (ref 78.0–100.0)
MONOS PCT: 10 % (ref 3–12)
MPV: 9.5 fL (ref 8.6–12.4)
Monocytes Absolute: 0.4 10*3/uL (ref 0.1–1.0)
NEUTROS PCT: 52 % (ref 43–77)
Neutro Abs: 2.1 10*3/uL (ref 1.7–7.7)
Platelets: 226 10*3/uL (ref 150–400)
RBC: 4.47 MIL/uL (ref 3.87–5.11)
RDW: 13 % (ref 11.5–15.5)
WBC: 4 10*3/uL (ref 4.0–10.5)

## 2015-07-12 LAB — COMPREHENSIVE METABOLIC PANEL
ALBUMIN: 4.2 g/dL (ref 3.6–5.1)
ALK PHOS: 54 U/L (ref 33–130)
ALT: 14 U/L (ref 6–29)
AST: 19 U/L (ref 10–35)
BUN: 15 mg/dL (ref 7–25)
CALCIUM: 9.5 mg/dL (ref 8.6–10.4)
CO2: 27 mmol/L (ref 20–31)
Chloride: 105 mmol/L (ref 98–110)
Creat: 0.86 mg/dL (ref 0.50–1.05)
Glucose, Bld: 96 mg/dL (ref 65–99)
Potassium: 4.3 mmol/L (ref 3.5–5.3)
Sodium: 141 mmol/L (ref 135–146)
Total Bilirubin: 0.6 mg/dL (ref 0.2–1.2)
Total Protein: 6 g/dL — ABNORMAL LOW (ref 6.1–8.1)

## 2015-07-12 LAB — HEPATITIS C ANTIBODY: HCV Ab: NEGATIVE

## 2015-07-12 LAB — TSH: TSH: 1.047 u[IU]/mL (ref 0.350–4.500)

## 2015-07-12 LAB — HIV ANTIBODY (ROUTINE TESTING W REFLEX): HIV 1&2 Ab, 4th Generation: NONREACTIVE

## 2015-07-13 LAB — VITAMIN D 25 HYDROXY (VIT D DEFICIENCY, FRACTURES): Vit D, 25-Hydroxy: 33 ng/mL (ref 30–100)

## 2015-12-05 ENCOUNTER — Ambulatory Visit (INDEPENDENT_AMBULATORY_CARE_PROVIDER_SITE_OTHER): Payer: BLUE CROSS/BLUE SHIELD | Admitting: Certified Nurse Midwife

## 2015-12-05 ENCOUNTER — Encounter: Payer: Self-pay | Admitting: Certified Nurse Midwife

## 2015-12-05 VITALS — BP 100/70 | HR 76 | Temp 97.9°F | Resp 18 | Ht 66.25 in | Wt 129.0 lb

## 2015-12-05 DIAGNOSIS — R300 Dysuria: Secondary | ICD-10-CM

## 2015-12-05 DIAGNOSIS — N390 Urinary tract infection, site not specified: Secondary | ICD-10-CM

## 2015-12-05 LAB — POCT URINALYSIS DIPSTICK

## 2015-12-05 MED ORDER — CIPROFLOXACIN HCL 500 MG PO TABS: 500.0000 mg | ORAL_TABLET | Freq: Two times a day (BID) | ORAL | Status: DC

## 2015-12-05 NOTE — Progress Notes (Signed)
60 y.o. Married Caucasian female [phone] [id number] here with complaint of UTI, with onset of 3 days.  Patient complaining of urinary frequency/urgency/ and pain with urination. Patient takes Macrobid post coital due to history chronic UTI symptoms and has been on for 3 days with Uristat and no change. Patient denies fever, chills, nausea or back pain. No new personal products. Patient feels related to sexual activity as always.  Denies any vaginal symptoms.   Menopausal with ? vaginal dryness and refuses pelvic exam for assessment. " I just need a prescription".. Patient not consuming adequate water intake. No other health issues today. Discussed with patient with her symptoms some exam does need to be done to assess appropriately. Will consent to limited exam, dressed.   O: Healthy female WDWN Affect: Normal, orientation x 3 Skin : warm and dry CVAT: negative bilateral Abdomen: positive for suprapubic tenderness  Patient refuses pelvic exam.   A: UTI per symptoms and history of chronic UTI, not responsive to Macrobid H/O positive urine cultures. Hydration poor   P: Reviewed findings of symptoms consistent with UTI and need for stopping Macrobid and switching to Cipro due to no change in symptoms. JX:[id number]:Cipro see order with instructions GNF:[id number]:Urine micro, culture Reviewed warning signs and symptoms of UTI and need to advise if occurring. Encouraged to limit soda, tea, and coffee and stressed need to increase water intake. Patient voiced understanding. Discussed vaginal dryness can contribute to UTI as well as poor hydration. Questions addressed.   RV prn

## 2015-12-05 NOTE — Progress Notes (Signed)
Reviewed personally.  M. Suzanne Jearldine Cassady, MD.  

## 2015-12-05 NOTE — Patient Instructions (Signed)

## 2015-12-06 ENCOUNTER — Telehealth: Payer: Self-pay | Admitting: *Deleted

## 2015-12-06 LAB — URINALYSIS, MICROSCOPIC ONLY
BACTERIA UA: NONE SEEN [HPF]
CRYSTALS: NONE SEEN [HPF]
Casts: NONE SEEN [LPF]
RBC / HPF: NONE SEEN RBC/HPF (ref <=2)
WBC, UA: >60 WBC/HPF — AB (ref <=5)
Yeast: NONE SEEN [HPF]

## 2015-12-06 LAB — URINE CULTURE: Colony Count: 7000

## 2015-12-06 NOTE — Telephone Encounter (Signed)
Patient notified see result note 

## 2015-12-06 NOTE — Telephone Encounter (Signed)
Notes Recorded by Dion Bodyeina C Beltran, CMA on 12/06/2015 at 10:51 AM LM for pt to call back. Notes Recorded by Verner Choleborah S Leonard, CNM on 12/06/2015 at 8:44 AM Notify patient that micro urine showed only WBC, no bacteria. Culture pending. Patient status? Instruct her to be sure and increase water intake .

## 2016-07-10 ENCOUNTER — Ambulatory Visit: Payer: BLUE CROSS/BLUE SHIELD | Admitting: Nurse Practitioner

## 2016-07-14 ENCOUNTER — Ambulatory Visit: Payer: BLUE CROSS/BLUE SHIELD | Admitting: Nurse Practitioner

## 2016-08-04 ENCOUNTER — Encounter: Payer: Self-pay | Admitting: Nurse Practitioner

## 2016-08-04 ENCOUNTER — Ambulatory Visit (INDEPENDENT_AMBULATORY_CARE_PROVIDER_SITE_OTHER): Payer: BLUE CROSS/BLUE SHIELD | Admitting: Nurse Practitioner

## 2016-08-04 VITALS — BP 120/80 | HR 64 | Resp 12 | Ht 66.0 in | Wt 124.4 lb

## 2016-08-04 DIAGNOSIS — M7989 Other specified soft tissue disorders: Secondary | ICD-10-CM

## 2016-08-04 DIAGNOSIS — Z01419 Encounter for gynecological examination (general) (routine) without abnormal findings: Secondary | ICD-10-CM

## 2016-08-04 DIAGNOSIS — Z23 Encounter for immunization: Secondary | ICD-10-CM

## 2016-08-04 DIAGNOSIS — Z0000 Encounter for general adult medical examination without abnormal findings: Secondary | ICD-10-CM

## 2016-08-04 MED ORDER — PHENAZOPYRIDINE HCL 95 MG PO TABS: 95.0000 mg | ORAL_TABLET | ORAL | 5 refills | Status: DC | PRN

## 2016-08-04 NOTE — Patient Instructions (Signed)

## 2016-08-04 NOTE — Progress Notes (Signed)
Patient ID: Sandra Young, female   DOB: 05-05-1956, 61 y.o.   MRN: [id number]  61 y.o. G4P4004 Married  Caucasian Fe here for annual exam.  This past year in 2017 had 4 new grand children. This year another grandchild is on the way.   She feels tired a lot but thinks this is related to one of families living with her.  She also has a "nodule" on her neck that has been there for 3 weeks. Initially felt tender but now only tender if manipulated.  There has been no trauma, no signs of thyroid problems.  She does have [facility] of thyroid with her mother.  She also has been off her Vit D recently.  Needs update on TDaP.  Patient's last menstrual period was 04/26/2012 (approximate).          Sexually active: Yes.    The current method of family planning is post menopausal status.    Exercising: Yes.    Weight lifting, cardio, yoga, pilates Smoker:  no  Health Maintenance: Pap:07/03/14, Negative with neg HR HPV MMG:08/01/14 3D with Implants with Diagnostic Left Mammogram and Ultrasound 08/07/14, Bi-Rads 1: Negative  Colonoscopy: 07/2008 Normal- repeat 10 years  BMD:03/26/06 (age 45) Z Score 1.2 Spine / 0.3 Left Femur Total TDaP: 02/2007 Hep C and HIV: 07/12/15 Labs: Blood drawn today   reports that she has never smoked. She has never used smokeless tobacco. She reports that she drinks about 3.6 - 4.2 oz of alcohol per week . She reports that she does not use drugs.  Past Medical History:  Diagnosis Date  . Abnormal Pap smear 11/24/11   ASCUS/AGUS/negative HR HPV  . Sciatic nerve pain     Past Surgical History:  Procedure Laterality Date  . BREAST SURGERY Bilateral 08/2003   Saline implants  . TUBAL LIGATION  1988    Current Outpatient Prescriptions  Medication Sig Dispense Refill  . Biotin 1000 MCG tablet Take 1,000 mcg by mouth 3 (three) times daily.    . Cholecalciferol (VITAMIN D) 2000 UNITS CAPS Take by mouth.    Marland Kitchen ibuprofen (ADVIL,MOTRIN) 200 MG tablet Take 200 mg by mouth every 6  (six) hours as needed for pain.    . phenazopyridine (URISTAT) 95 MG tablet Take 95 mg by mouth as needed for pain.     No current facility-administered medications for this visit.     Family History  Problem Relation Age of Onset  . Breast cancer Mother 7  . Cancer Father 60    Lung    ROS:  Pertinent items are noted in HPI.  Otherwise, a comprehensive ROS was negative.  Exam:   BP 120/80 (BP Location: Right Arm, Patient Position: Sitting, Cuff Size: Small)   Pulse 64   Resp 12   Ht 5\' 6"  (1.676 m)   Wt 124 lb 6.4 oz (56.4 kg)   LMP 04/26/2012 (Approximate)   BMI 20.08 kg/m  Height: 5\' 6"  (167.6 cm) Ht Readings from Last 3 Encounters:  08/04/16 5\' 6"  (1.676 m)  12/05/15 5' 6.25" (1.683 m)  07/08/15 5' 6.25" (1.683 m)    General appearance: alert, cooperative and appears stated age Head: Normocephalic, without obvious abnormality, atraumatic Neck: no adenopathy, supple, symmetrical, trachea midline and thyroid normal to inspection and palpation and there is a soft tissue swelling right at the left thyroid.  No other thyroid lesion or nodules, no other lymphadenopathy.  Pt also examined by Ms. Debbie. Lungs: clear to auscultation bilaterally Breasts: normal appearance,  no masses or tenderness, positive findings: implants bilaterally Heart: regular rate and rhythm Abdomen: soft, non-tender; no masses,  no organomegaly Extremities: extremities normal, atraumatic, no cyanosis or edema Skin: Skin color, texture, turgor normal. No rashes or lesions Lymph nodes: Cervical, supraclavicular, and axillary nodes normal. No abnormal inguinal nodes palpated Neurologic: Grossly normal   Pelvic: External genitalia:  no lesions              Urethra:  normal appearing urethra with no masses, tenderness or lesions              Bartholin's and Skene's: normal                 Vagina: normal appearing vagina with normal color and discharge, no lesions              Cervix: anteverted               Pap taken: Yes.   Bimanual Exam:  Uterus:  normal size, contour, position, consistency, mobility, non-tender              Adnexa: no mass, fullness, tenderness               Rectovaginal: Confirms               Anus:  normal sphincter tone, no lesions  Chaperone present: yes  A:  Well Woman with normal exam  Postmenopausal, No HRT Vit D deficiency H/O AGUS/ASCUS pap 4/13 with neg evaluation and normal since Fatigue and anxious - 4 new grand children in 2017:  1 more in 2018   Thyroid or soft tissue nodule - will get US  Update immunization  P:   Reviewed health and wellness pertinent to exam  Pap smear was done  Mammogram is due and pt will call  Will get thyroid and soft tissue US and follow  Will follow with labs  TDaP given today  Counseled on breast self exam, mammography screening, adequate intake of calcium and vitamin D, diet and exercise return annually or prn  An After Visit Summary was printed and given to the patient.

## 2016-08-05 LAB — LIPID PANEL
Cholesterol: 209 mg/dL — ABNORMAL HIGH (ref <200)
HDL: 102 mg/dL (ref >50)
LDL CALC: 90 mg/dL (ref <100)
TRIGLYCERIDES: 85 mg/dL (ref <150)
Total CHOL/HDL Ratio: 2 Ratio (ref <5.0)
VLDL: 17 mg/dL (ref <30)

## 2016-08-05 LAB — CBC WITH DIFFERENTIAL/PLATELET
Basophils Absolute: 66 cells/uL (ref 0–200)
Basophils Relative: 1 %
EOS ABS: 66 {cells}/uL (ref 15–500)
Eosinophils Relative: 1 %
HEMATOCRIT: 41.5 % (ref 35.0–45.0)
HEMOGLOBIN: 13.7 g/dL (ref 11.7–15.5)
LYMPHS ABS: 1980 {cells}/uL (ref 850–3900)
Lymphocytes Relative: 30 %
MCH: 30.6 pg (ref 27.0–33.0)
MCHC: 33 g/dL (ref 32.0–36.0)
MCV: 92.6 fL (ref 80.0–100.0)
MPV: 9.5 fL (ref 7.5–12.5)
Monocytes Absolute: 528 cells/uL (ref 200–950)
Monocytes Relative: 8 %
Neutro Abs: 3960 cells/uL (ref 1500–7800)
Neutrophils Relative %: 60 %
Platelets: 253 10*3/uL (ref 140–400)
RBC: 4.48 MIL/uL (ref 3.80–5.10)
RDW: 13.5 % (ref 11.0–15.0)
WBC: 6.6 10*3/uL (ref 3.8–10.8)

## 2016-08-05 LAB — COMPREHENSIVE METABOLIC PANEL
ALBUMIN: 4.7 g/dL (ref 3.6–5.1)
ALT: 15 U/L (ref 6–29)
AST: 19 U/L (ref 10–35)
Alkaline Phosphatase: 47 U/L (ref 33–130)
BUN: 10 mg/dL (ref 7–25)
CALCIUM: 10 mg/dL (ref 8.6–10.4)
CO2: 21 mmol/L (ref 20–31)
Chloride: 105 mmol/L (ref 98–110)
Creat: 0.89 mg/dL (ref 0.50–1.05)
Glucose, Bld: 82 mg/dL (ref 65–99)
Potassium: 3.8 mmol/L (ref 3.5–5.3)
Sodium: 142 mmol/L (ref 135–146)
Total Bilirubin: 0.6 mg/dL (ref 0.2–1.2)
Total Protein: 6.7 g/dL (ref 6.1–8.1)

## 2016-08-05 LAB — THYROID PANEL WITH TSH
Free Thyroxine Index: 2.6 (ref 1.4–3.8)
T3 Uptake: 35 % (ref 22–35)
T4 TOTAL: 7.5 ug/dL (ref 4.5–12.0)
TSH: 0.94 mIU/L

## 2016-08-05 LAB — VITAMIN D 25 HYDROXY (VIT D DEFICIENCY, FRACTURES): Vit D, 25-Hydroxy: 30 ng/mL (ref 30–100)

## 2016-08-06 LAB — IPS PAP TEST WITH HPV

## 2016-08-07 ENCOUNTER — Other Ambulatory Visit: Payer: Self-pay | Admitting: Nurse Practitioner

## 2016-08-07 ENCOUNTER — Ambulatory Visit
Admission: RE | Admit: 2016-08-07 | Discharge: 2016-08-07 | Disposition: A | Discharge status: Home / Self Care | Payer: BLUE CROSS/BLUE SHIELD | Source: Ambulatory Visit | Attending: Nurse Practitioner | Admitting: Nurse Practitioner

## 2016-08-07 DIAGNOSIS — M7989 Other specified soft tissue disorders: Secondary | ICD-10-CM

## 2016-08-07 DIAGNOSIS — E041 Nontoxic single thyroid nodule: Secondary | ICD-10-CM | POA: Diagnosis not present

## 2016-08-09 NOTE — Progress Notes (Signed)
Encounter reviewed by Dr. Leon Montoya Amundson C. Silva.  

## 2016-08-17 ENCOUNTER — Telehealth: Payer: Self-pay | Admitting: Nurse Practitioner

## 2016-08-17 DIAGNOSIS — R221 Localized swelling, mass and lump, neck: Secondary | ICD-10-CM

## 2016-08-17 NOTE — Telephone Encounter (Signed)
Pt thyroid US is back and does show a thyroid nodule at the Isthmus midline that is 1.1 cm.  This thyroid nodule does not meet criteria for biopsy.  After discussion wit Dr. Edward JollySilva - this is not the true area of the soft tissue nodule that was felt which was left of the midline.  She suggest ENT evaluation.  Pt states this has not gone away and maybe a little larger since the ultrasound.  Will make a consult for Dr. Osborn Cohoavid Shoemaker and pt is OK with plan.  Will send note to referral desk.

## 2016-08-20 NOTE — Telephone Encounter (Signed)
Patient called to check on the status of the ENT referral.

## 2016-09-07 DIAGNOSIS — E041 Nontoxic single thyroid nodule: Secondary | ICD-10-CM | POA: Insufficient documentation

## 2016-09-18 ENCOUNTER — Telehealth: Payer: Self-pay | Admitting: *Deleted

## 2016-09-18 NOTE — Telephone Encounter (Signed)
Patient returning your call.

## 2016-09-18 NOTE — Telephone Encounter (Signed)
Left voicemail for pt to call back. Re: questions from Mrs. Patty

## 2016-09-21 NOTE — Telephone Encounter (Signed)
Left voice mail to call back 

## 2016-09-21 NOTE — Telephone Encounter (Signed)
Patient called back. She will follow up with Dr. Annalee GentaShoemaker in 6 months.   Mrs. Sandra Young FYI  Encounter closed.

## 2016-12-19 DIAGNOSIS — J069 Acute upper respiratory infection, unspecified: Secondary | ICD-10-CM | POA: Diagnosis not present

## 2016-12-19 DIAGNOSIS — H109 Unspecified conjunctivitis: Secondary | ICD-10-CM | POA: Diagnosis not present

## 2017-02-19 ENCOUNTER — Telehealth: Payer: Self-pay | Admitting: Obstetrics and Gynecology

## 2017-02-19 NOTE — Telephone Encounter (Signed)
Left message regarding upcoming appointment has been canceled and needs to be rescheduled. °

## 2017-03-23 ENCOUNTER — Other Ambulatory Visit: Payer: Self-pay | Admitting: Obstetrics & Gynecology

## 2017-03-23 NOTE — Telephone Encounter (Signed)
See patient message below. Med refill request: Nitrofurantoin 100 mg caps; one daily as needed.   Last filled 07/08/15 #30/3RF,  [facility] Last AEX: 08/04/16 Ria Comment, NP Next AEX: 08/16/17  -Dr. Hyacinth Meeker  Refill authorized: Dr. Hyacinth Meeker, Please Advise on refill?

## 2017-03-23 NOTE — Telephone Encounter (Signed)
Patient called and requested a prescription for Nitrofurantoin to be sent to the pharmacy on file. She said she used to get this "as a prophylactic from Patty." She said she is not currently having any symptoms but she forgot to get refills on this at her last AEX.

## 2017-03-24 MED ORDER — NITROFURANTOIN MACROCRYSTAL 100 MG PO CAPS: ORAL_CAPSULE | ORAL | 2 refills | Status: DC

## 2017-03-24 NOTE — Telephone Encounter (Signed)
Spoke with patient, advised [facility]. Patient thankful and verbalizes understanding.

## 2017-03-24 NOTE — Telephone Encounter (Signed)
Refill has been completed.  Ok to close encounter. 

## 2017-08-06 ENCOUNTER — Ambulatory Visit: Payer: BLUE CROSS/BLUE SHIELD | Admitting: Nurse Practitioner

## 2017-08-16 ENCOUNTER — Ambulatory Visit (INDEPENDENT_AMBULATORY_CARE_PROVIDER_SITE_OTHER): Payer: BLUE CROSS/BLUE SHIELD | Admitting: Obstetrics & Gynecology

## 2017-08-16 ENCOUNTER — Other Ambulatory Visit: Payer: Self-pay

## 2017-08-16 ENCOUNTER — Encounter: Payer: Self-pay | Admitting: Obstetrics & Gynecology

## 2017-08-16 VITALS — BP 106/70 | HR 76 | Resp 14 | Ht 66.0 in | Wt 125.0 lb

## 2017-08-16 DIAGNOSIS — E2839 Other primary ovarian failure: Secondary | ICD-10-CM | POA: Diagnosis not present

## 2017-08-16 DIAGNOSIS — M2550 Pain in unspecified joint: Secondary | ICD-10-CM | POA: Diagnosis not present

## 2017-08-16 DIAGNOSIS — Z01419 Encounter for gynecological examination (general) (routine) without abnormal findings: Secondary | ICD-10-CM

## 2017-08-16 MED ORDER — PHENAZOPYRIDINE HCL 95 MG PO TABS: 95.0000 mg | ORAL_TABLET | ORAL | 2 refills | Status: DC | PRN

## 2017-08-16 MED ORDER — NITROFURANTOIN MACROCRYSTAL 100 MG PO CAPS: ORAL_CAPSULE | ORAL | 2 refills | Status: DC

## 2017-08-16 NOTE — Progress Notes (Signed)
62 y.o. [id number] MarriedCaucasianF here for annual exam.  Had nodule she noted on her neck last year.  She did have thyroid ultrasound and then had ENT referral.  ENT thought this was a lymph node.  This has resolved.  Denies vaginal bleeding.    Having a lot more joint pain.  Reports that getting up off the couch is much more painful.    Patient's last menstrual period was 04/26/2012 (approximate).          Sexually active: Yes.    The current method of family planning is post menopausal status.  Exercising: Yes.    weight lifting, yoga, cardio Smoker:  no  Health Maintenance: Pap:  08/04/16 Neg. HR HPV:neg   07/03/14 neg. HR HPV:neg  History of abnormal Pap:  Yes, ASCUS MMG:  08/07/14 [place] left BIRADS1:neg. f/u 1 year.  Colonoscopy:  2010 Normal. F/u 10 years BMD:   03/26/2006 TDaP:  2018 Pneumonia vaccine(s):  No Shingrix: On waiting list  Hep C testing: 07/12/15 Neg  Screening Labs: All negative 08/04/16   reports that  has never smoked. she has never used smokeless tobacco. She reports that she drinks about 3.6 - 4.2 oz of alcohol per week. She reports that she does not use drugs.  Past Medical History:  Diagnosis Date  . Abnormal Pap smear 11/24/11   ASCUS/AGUS/negative HR HPV  . Sciatic nerve pain     Past Surgical History:  Procedure Laterality Date  . BREAST SURGERY Bilateral 08/2003   Saline implants  . TUBAL LIGATION  1988    Current Outpatient Medications  Medication Sig Dispense Refill  . Biotin 1000 MCG tablet Take 1,000 mcg by mouth 3 (three) times daily.    . Cholecalciferol (VITAMIN D) 2000 UNITS CAPS Take by mouth.    Marland Kitchen ibuprofen (ADVIL,MOTRIN) 200 MG tablet Take 200 mg by mouth every 6 (six) hours as needed for pain.    . nitrofurantoin (MACRODANTIN) 100 MG capsule 1 tab daily as needed for UTI suppression. 30 capsule 2  . phenazopyridine (URISTAT) 95 MG tablet Take 1 tablet (95 mg total) by mouth as needed for pain. 30 tablet 5   No current  facility-administered medications for this visit.     Family History  Problem Relation Age of Onset  . Cancer Father 27       Lung  . Breast cancer Mother 54    ROS:  Pertinent items are noted in HPI.  Otherwise, a comprehensive ROS was negative.  Exam:   BP 106/70 (BP Location: Right Arm, Patient Position: Sitting, Cuff Size: Normal)   Pulse 76   Resp 14   Ht 5' 6"  (1.676 m)   Wt 125 lb (56.7 kg)   LMP 04/26/2012 (Approximate)   BMI 20.18 kg/m   Weight change: +1#   Height: 5' 6"  (167.6 cm)  Ht Readings from Last 3 Encounters:  08/16/17 5' 6"  (1.676 m)  08/04/16 5' 6"  (1.676 m)  12/05/15 5' 6.25" (1.683 m)    General appearance: alert, cooperative and appears stated age Head: Normocephalic, without obvious abnormality, atraumatic Neck: no adenopathy, supple, symmetrical, trachea midline and thyroid normal to inspection and palpation Lungs: clear to auscultation bilaterally Breasts: normal appearance, no masses or tenderness Heart: regular rate and rhythm Abdomen: soft, non-tender; bowel sounds normal; no masses,  no organomegaly Extremities: extremities normal, atraumatic, no cyanosis or edema Skin: Skin color, texture, turgor normal. No rashes or lesions Lymph nodes: Cervical, supraclavicular, and axillary nodes normal. No abnormal  inguinal nodes palpated Neurologic: Grossly normal   Pelvic: External genitalia:  no lesions              Urethra:  normal appearing urethra with no masses, tenderness or lesions              Bartholins and Skenes: normal                 Vagina: normal appearing vagina with normal color and discharge, no lesions              Cervix: no lesions              Pap taken: No. Bimanual Exam:  Uterus:  normal size, contour, position, consistency, mobility, non-tender              Adnexa: normal adnexa and no mass, fullness, tenderness               Rectovaginal: Confirms               Anus:  normal sphincter tone, no lesions  Chaperone was  present for exam.  A:  Well Woman with normal exam PMP, no HRT H/o recurrent UTIs H/O AGUS/ASCUS pap 4/13 with negative evaluation Joint pain, hip pain  P:   Mammogram is overdue.  Pt aware.  Does not want me to schedule BMD ordered for pt.  She will do with MMG. ANA, RA, ESR obtained today pap smear not indicated RF for macribid 167m post coitally.  #30/2RF. Dermatology exam recommended for screening purposes. return annually or prn

## 2017-08-17 LAB — SEDIMENTATION RATE: SED RATE: 8 mm/h (ref 0–40)

## 2017-08-17 LAB — RHEUMATOID FACTOR: Rhuematoid fact SerPl-aCnc: 12.8 IU/mL (ref 0.0–13.9)

## 2017-08-17 LAB — ANA: Anti Nuclear Antibody(ANA): NEGATIVE

## 2017-10-08 ENCOUNTER — Other Ambulatory Visit: Payer: Self-pay | Admitting: Obstetrics & Gynecology

## 2017-10-08 ENCOUNTER — Telehealth: Payer: Self-pay | Admitting: Obstetrics & Gynecology

## 2017-10-08 DIAGNOSIS — Z139 Encounter for screening, unspecified: Secondary | ICD-10-CM

## 2017-10-08 NOTE — Telephone Encounter (Signed)
Patient called and left a message on our voice mail at lunch requesting a call from the nurse.

## 2017-10-08 NOTE — Telephone Encounter (Signed)
Patient has some discharge and would like to speak with a nurse. Last seen 08/16/17.

## 2017-10-08 NOTE — Telephone Encounter (Signed)
Spoke with patient. Reports green, yellow vaginal d/c with odor for 2 weeks. Treated with OTC monistat with no changes.   Denies pain or bleeding.   Recommended OV for further evaluation, advised Dr. Hyacinth MeekerMiller will be out of the office until 3/21, patient agreeable to scheduling with covering provider. OV scheduled with Dr. Edward JollySilva on 3/18 at 10am.   Routing to provider for final review. Patient is agreeable to disposition. Will close encounter.  Cc: Dr. Edward JollySilva

## 2017-10-11 ENCOUNTER — Other Ambulatory Visit: Payer: Self-pay

## 2017-10-11 ENCOUNTER — Ambulatory Visit (INDEPENDENT_AMBULATORY_CARE_PROVIDER_SITE_OTHER): Payer: BLUE CROSS/BLUE SHIELD | Admitting: Obstetrics and Gynecology

## 2017-10-11 ENCOUNTER — Encounter: Payer: Self-pay | Admitting: Obstetrics and Gynecology

## 2017-10-11 VITALS — BP 102/64 | HR 78 | Resp 12 | Wt 124.5 lb

## 2017-10-11 DIAGNOSIS — N760 Acute vaginitis: Secondary | ICD-10-CM

## 2017-10-11 NOTE — Progress Notes (Signed)
GYNECOLOGY  VISIT   HPI: 62 y.o.   Married  Caucasian  female   [phone] with Patient's last menstrual period was 04/26/2012 (approximate).   here for   Vaginitis.  Green yellow discharge for 3 weeks.  Slight odor.  No itching or burning.   Tried Monistat 3 weeks ago and no improvement.   No recent abx or steroids.  Has Rx for UTI prophylaxis for postcoital UTIs.  No recent use.   Living at daughter's house and so new detergents.   No new partners.  GYNECOLOGIC HISTORY: Patient's last menstrual period was 04/26/2012 (approximate). Contraception:  postmenopausal Menopausal hormone therapy:  none Last mammogram:  08/07/14 US left BIRADS1:neg. f/u 1 year -- scheduled 11/12/17 Last pap smear:   08/04/16 Neg. HR HPV:neg        OB History    Gravida Para Term Preterm AB Living   4 4 4  0 0 4   SAB TAB Ectopic Multiple Live Births   0 0 0 0 4         Patient Active Problem List   Diagnosis Date Noted  . Cystic thyroid nodule 09/07/2016    Past Medical History:  Diagnosis Date  . Abnormal Pap smear 11/24/11   ASCUS/AGUS/negative HR HPV  . Sciatic nerve pain     Past Surgical History:  Procedure Laterality Date  . BREAST SURGERY Bilateral 08/2003   Saline implants  . TUBAL LIGATION  1988    Current Outpatient Medications  Medication Sig Dispense Refill  . Biotin 1000 MCG tablet Take 1,000 mcg by mouth 3 (three) times daily.    . Cholecalciferol (VITAMIN D) 2000 UNITS CAPS Take by mouth.    Marland Kitchen. ibuprofen (ADVIL,MOTRIN) 200 MG tablet Take 200 mg by mouth every 6 (six) hours as needed for pain.    . nitrofurantoin (MACRODANTIN) 100 MG capsule 1 tab daily as needed for UTI suppression. 30 capsule 2   No current facility-administered medications for this visit.      ALLERGIES: Patient has no known allergies.  Family History  Problem Relation Age of Onset  . Cancer Father [phone]       Lung  . Breast cancer Mother [phone]    Social History   Socioeconomic History  . Marital  status: Married    Spouse name: Not on file  . Number of children: Not on file  . Years of education: Not on file  . Highest education level: Not on file  Social Needs  . Financial resource strain: Not on file  . Food insecurity - worry: Not on file  . Food insecurity - inability: Not on file  . Transportation needs - medical: Not on file  . Transportation needs - non-medical: Not on file  Occupational History  . Not on file  Tobacco Use  . Smoking status: Never Smoker  . Smokeless tobacco: Never Used  Substance and Sexual Activity  . Alcohol use: Yes    Alcohol/week: 3.6 - 4.2 oz    Types: 3 Glasses of wine, 3 - 4 Standard drinks or equivalent per week  . Drug use: No  . Sexual activity: Yes    Partners: Male    Birth control/protection: Surgical    Comment: BTL  Other Topics Concern  . Not on file  Social History Narrative  . Not on file    ROS:  Pertinent items are noted in HPI.  PHYSICAL EXAMINATION:    BP 102/64 (BP Location: Right Arm, Patient Position: Sitting, Cuff  Size: Normal)   Pulse 78   Resp 12   Wt 124 lb 8 oz (56.5 kg)   LMP 04/26/2012 (Approximate)   BMI 20.09 kg/m     General appearance: alert, cooperative and appears stated age   Pelvic: External genitalia:  no lesions.  Shaves vulva.              Urethra:  normal appearing urethra with no masses, tenderness or lesions              Bartholins and Skenes: normal                 Vagina: normal appearing vagina with normal color and discharge, no lesions              Cervix: no lesions                Bimanual Exam:  Uterus:  normal size, contour, position, consistency, mobility, non-tender              Adnexa: no mass, fullness, tenderness               Chaperone was present for exam.  ASSESSMENT  Vaginitis.   PLAN  Affirm.  Will wait to do treatment.  Discussed skin friendly products.    An After Visit Summary was printed and given to the patient.  __15____ minutes face to face time  of which over 50% was spent in counseling.

## 2017-10-11 NOTE — Patient Instructions (Signed)

## 2017-10-12 LAB — VAGINITIS/VAGINOSIS, DNA PROBE
CANDIDA SPECIES: NEGATIVE
Gardnerella vaginalis: NEGATIVE
Trichomonas vaginosis: NEGATIVE

## 2017-11-12 ENCOUNTER — Other Ambulatory Visit: Payer: BLUE CROSS/BLUE SHIELD

## 2017-11-12 ENCOUNTER — Ambulatory Visit: Payer: BLUE CROSS/BLUE SHIELD

## 2017-12-06 ENCOUNTER — Ambulatory Visit
Admission: RE | Admit: 2017-12-06 | Discharge: 2017-12-06 | Disposition: A | Discharge status: Home / Self Care | Payer: BLUE CROSS/BLUE SHIELD | Source: Ambulatory Visit | Attending: Obstetrics & Gynecology | Admitting: Obstetrics & Gynecology

## 2017-12-06 DIAGNOSIS — E2839 Other primary ovarian failure: Secondary | ICD-10-CM

## 2017-12-06 DIAGNOSIS — Z139 Encounter for screening, unspecified: Secondary | ICD-10-CM

## 2017-12-06 DIAGNOSIS — M8588 Other specified disorders of bone density and structure, other site: Secondary | ICD-10-CM | POA: Diagnosis not present

## 2017-12-06 DIAGNOSIS — M810 Age-related osteoporosis without current pathological fracture: Secondary | ICD-10-CM | POA: Diagnosis not present

## 2017-12-06 DIAGNOSIS — Z1231 Encounter for screening mammogram for malignant neoplasm of breast: Secondary | ICD-10-CM | POA: Diagnosis not present

## 2017-12-06 DIAGNOSIS — Z780 Asymptomatic menopausal state: Secondary | ICD-10-CM | POA: Diagnosis not present

## 2017-12-16 ENCOUNTER — Telehealth: Payer: Self-pay | Admitting: *Deleted

## 2017-12-16 NOTE — Telephone Encounter (Signed)
LM for pt to call back.

## 2017-12-16 NOTE — Telephone Encounter (Signed)
-----   Message from Jerene Bears, MD sent at 12/15/2017 11:39 AM EDT ----- Please let pt know her BMD showed borderline osteoporosis.  Her last BMD was normal but that was several years ago.  I don't think we need to start treatment at this time but there is some blood work I'd like to do on her.  Please schedule OV to discuss BMD and plan blood work.  Does not need to be fasting when she comes for the OV or blood work.  Thanks.

## 2017-12-16 NOTE — Telephone Encounter (Signed)
Spoke with patient, advised as seen below per Dr. Hyacinth Meeker. OV scheduled for 12/24/17 at 3pm with Dr. Hyacinth Meeker. Patient verbalizes understanding and is agreeable. Will close encounter.

## 2017-12-16 NOTE — Telephone Encounter (Signed)
Patient returning Reina's call. °

## 2017-12-24 ENCOUNTER — Encounter: Payer: Self-pay | Admitting: Obstetrics & Gynecology

## 2017-12-24 ENCOUNTER — Ambulatory Visit (INDEPENDENT_AMBULATORY_CARE_PROVIDER_SITE_OTHER): Payer: BLUE CROSS/BLUE SHIELD | Admitting: Obstetrics & Gynecology

## 2017-12-24 ENCOUNTER — Other Ambulatory Visit: Payer: Self-pay

## 2017-12-24 VITALS — BP 94/64 | HR 72 | Resp 16 | Ht 66.0 in | Wt 127.0 lb

## 2017-12-24 DIAGNOSIS — M810 Age-related osteoporosis without current pathological fracture: Secondary | ICD-10-CM | POA: Diagnosis not present
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
## 2017-12-25 LAB — COMPREHENSIVE METABOLIC PANEL
A/G RATIO: 1.9 (ref 1.2–2.2)
ALT: 9 IU/L (ref 0–32)
AST: 15 IU/L (ref 0–40)
Albumin: 4.3 g/dL (ref 3.6–4.8)
Alkaline Phosphatase: 69 IU/L (ref 39–117)
BILIRUBIN TOTAL: 0.3 mg/dL (ref 0.0–1.2)
BUN/Creatinine Ratio: 14 (ref 12–28)
BUN: 11 mg/dL (ref 8–27)
CHLORIDE: 103 mmol/L (ref 96–106)
CO2: 23 mmol/L (ref 20–29)
Calcium: 9.4 mg/dL (ref 8.7–10.3)
Creatinine, Ser: 0.76 mg/dL (ref 0.57–1.00)
GFR, EST AFRICAN AMERICAN: 99 mL/min/{1.73_m2} (ref >59)
GFR, EST NON AFRICAN AMERICAN: 86 mL/min/{1.73_m2} (ref >59)
GLOBULIN, TOTAL: 2.3 g/dL (ref 1.5–4.5)
Glucose: 85 mg/dL (ref 65–99)
Potassium: 4.1 mmol/L (ref 3.5–5.2)
SODIUM: 141 mmol/L (ref 134–144)
TOTAL PROTEIN: 6.6 g/dL (ref 6.0–8.5)

## 2017-12-25 LAB — TSH: TSH: 1.24 u[IU]/mL (ref 0.450–4.500)

## 2017-12-25 LAB — VITAMIN D 25 HYDROXY (VIT D DEFICIENCY, FRACTURES): VIT D 25 HYDROXY: 34.3 ng/mL (ref 30.0–100.0)

## 2017-12-25 LAB — PHOSPHORUS: Phosphorus: 3.2 mg/dL (ref 2.5–4.5)

## 2017-12-25 LAB — PARATHYROID HORMONE, INTACT (NO CA): PTH: 33 pg/mL (ref 15–65)

## 2017-12-28 NOTE — Addendum Note (Signed)
Addended by: Jerene BearsMILLER, Kellen Dutch S on: 12/28/2017 11:51 PM   Modules accepted: Orders

## 2018-01-04 ENCOUNTER — Telehealth: Payer: Self-pay | Admitting: *Deleted

## 2018-01-04 NOTE — Telephone Encounter (Signed)
Left voicemail to call back. Making sure she got the message Friday

## 2018-01-04 NOTE — Telephone Encounter (Signed)
Patient returning Sandra Young's call. She has been out of town, but will do the 24 hour urine either Thursday or Friday.

## 2018-01-04 NOTE — Telephone Encounter (Signed)
-----   Message from Jerene BearsMary S Miller, MD sent at 12/28/2017 11:50 PM EDT ----- Please let pt know her test results (TSH, VitD, CMP, Phosphorus, PTH) were all normal.  I would like her to do a 24 hour urine for total calcium.  Order placed.  She already has the jug.  She needs to discard the first void of the morning she starts but then collect all urine throughout the day and the first urine of the next day.  Thanks.

## 2018-01-07 ENCOUNTER — Other Ambulatory Visit (INDEPENDENT_AMBULATORY_CARE_PROVIDER_SITE_OTHER): Payer: BLUE CROSS/BLUE SHIELD

## 2018-01-07 DIAGNOSIS — M810 Age-related osteoporosis without current pathological fracture: Secondary | ICD-10-CM

## 2018-01-08 LAB — CALCIUM, URINE, 24 HOUR
Calcium, 24H Urine: 279 mg/24 hr (ref 100.0–300.0)
Calcium, Urine: 9.3 mg/dL

## 2018-02-09 DIAGNOSIS — R300 Dysuria: Secondary | ICD-10-CM | POA: Diagnosis not present

## 2018-02-11 DIAGNOSIS — Z23 Encounter for immunization: Secondary | ICD-10-CM | POA: Diagnosis not present

## 2018-02-25 DIAGNOSIS — M1711 Unilateral primary osteoarthritis, right knee: Secondary | ICD-10-CM | POA: Diagnosis not present

## 2018-02-25 DIAGNOSIS — M25562 Pain in left knee: Secondary | ICD-10-CM | POA: Diagnosis not present

## 2018-02-25 DIAGNOSIS — M25561 Pain in right knee: Secondary | ICD-10-CM | POA: Diagnosis not present

## 2018-02-25 DIAGNOSIS — M1712 Unilateral primary osteoarthritis, left knee: Secondary | ICD-10-CM | POA: Diagnosis not present

## 2018-03-02 DIAGNOSIS — M170 Bilateral primary osteoarthritis of knee: Secondary | ICD-10-CM | POA: Diagnosis not present

## 2018-03-02 DIAGNOSIS — M25561 Pain in right knee: Secondary | ICD-10-CM | POA: Diagnosis not present

## 2018-03-02 DIAGNOSIS — M25562 Pain in left knee: Secondary | ICD-10-CM | POA: Diagnosis not present

## 2018-07-21 DIAGNOSIS — Z23 Encounter for immunization: Secondary | ICD-10-CM | POA: Diagnosis not present

## 2018-10-13 ENCOUNTER — Other Ambulatory Visit: Payer: Self-pay | Admitting: Obstetrics & Gynecology

## 2018-10-13 NOTE — Telephone Encounter (Signed)
Medication refill request: macrobid 100mg  post coital Last AEX:  08-16-17 Next AEX: 10-18-18 Last MMG (if hormonal medication request): n/a Refill authorized: patient needs refill to last to aex. Please approve if appropriate

## 2018-10-14 ENCOUNTER — Telehealth: Payer: Self-pay | Admitting: Obstetrics & Gynecology

## 2018-10-14 NOTE — Telephone Encounter (Signed)
Left patient a message to call back to reschedule a future appointment that was cancelled by the provider. °

## 2018-10-18 ENCOUNTER — Other Ambulatory Visit: Payer: Self-pay | Admitting: Obstetrics & Gynecology

## 2018-10-18 ENCOUNTER — Ambulatory Visit: Payer: BLUE CROSS/BLUE SHIELD | Admitting: Obstetrics & Gynecology

## 2018-10-18 NOTE — Telephone Encounter (Signed)
Refill request received for Macrodantin 100 mg capsule. Rx sent on 10/14/18 for #30/0RF. Call placed to [facility] pharmacy, spoke Shanda Bumps, advised Rx on file and ready for pick up.   Call to patient, advised of Rx. Patient will f/u with pharmacy for filling, is aware to return call to office if any additional assistance needed.   RX refused.   Routing to provider for final review. Patient is agreeable to disposition. Will close encounter.

## 2019-05-19 ENCOUNTER — Telehealth: Payer: Self-pay | Admitting: Obstetrics & Gynecology

## 2019-05-19 NOTE — Telephone Encounter (Signed)
Patient needs to schedule annual exam with Dr. Sabra Heck. Next available around 12/2019. States she will possibly be losing her insurance at the end of the year and would like to know if theres any way to get her in sooner.

## 2019-05-19 NOTE — Telephone Encounter (Signed)
Spoke with patient. Last AEX 08/16/17. Request to schedule AEX. AEX scheduled for 12/22 at 3pm with Dr. Sabra Heck. Patient verbalizes understanding.   Routing to provider for final review. Patient is agreeable to disposition. Will close encounter.

## 2019-07-14 ENCOUNTER — Other Ambulatory Visit: Payer: Self-pay

## 2019-07-17 ENCOUNTER — Other Ambulatory Visit: Payer: Self-pay | Admitting: Obstetrics & Gynecology

## 2019-07-17 ENCOUNTER — Telehealth: Payer: Self-pay | Admitting: *Deleted

## 2019-07-17 DIAGNOSIS — Z0000 Encounter for general adult medical examination without abnormal findings: Secondary | ICD-10-CM

## 2019-07-17 NOTE — Telephone Encounter (Signed)
Yes.  She does need a fasting lipid done tomorrow.  I will place orders.  Thanks.

## 2019-07-17 NOTE — Telephone Encounter (Signed)
Dr Sabra Heck please advise you would like for pt for fasting labs tomorrow am at 0845 and has AEX tomorrow at 3pm.

## 2019-07-17 NOTE — Telephone Encounter (Signed)
Patient is scheduled for fasting labs before appointment Tuesday. Message to triage to put orders in computer.

## 2019-07-18 ENCOUNTER — Other Ambulatory Visit: Payer: Self-pay

## 2019-07-18 ENCOUNTER — Other Ambulatory Visit (INDEPENDENT_AMBULATORY_CARE_PROVIDER_SITE_OTHER): Payer: BC Managed Care – PPO

## 2019-07-18 ENCOUNTER — Encounter: Payer: Self-pay | Admitting: Obstetrics & Gynecology

## 2019-07-18 ENCOUNTER — Ambulatory Visit (INDEPENDENT_AMBULATORY_CARE_PROVIDER_SITE_OTHER): Payer: BC Managed Care – PPO | Admitting: Obstetrics & Gynecology

## 2019-07-18 VITALS — BP 124/76 | HR 64 | Temp 96.3°F | Ht 65.75 in | Wt 126.4 lb

## 2019-07-18 DIAGNOSIS — Z0000 Encounter for general adult medical examination without abnormal findings: Secondary | ICD-10-CM

## 2019-07-18 DIAGNOSIS — Z01419 Encounter for gynecological examination (general) (routine) without abnormal findings: Secondary | ICD-10-CM

## 2019-07-18 DIAGNOSIS — M810 Age-related osteoporosis without current pathological fracture: Secondary | ICD-10-CM

## 2019-07-18 MED ORDER — NITROFURANTOIN MACROCRYSTAL 100 MG PO CAPS: ORAL_CAPSULE | ORAL | 1 refills | Status: DC

## 2019-07-18 NOTE — Progress Notes (Signed)
63 y.o. [id number] Married White or Caucasian female here for annual exam.  Doing well.  Now has 10 grandchildren.  Has 9 under the age of 48. Patient's last menstrual period was 04/26/2012 (approximate).          Sexually active: Yes.    The current method of family planning is post menopausal status.    Exercising: Yes.    pilates and weights Smoker:  no  Health Maintenance: Pap:  08/04/16 Neg. HR HPV:neg              07/03/14 neg. HR HPV:neg  History of abnormal Pap:  Yes, ASCUS MMG:  12/06/17 BIRADS 1 negative/density c Colonoscopy:  2010 Normal. F/u 10 years.  She is aware this is due.  She decided to postpone this in 2020.   BMD:   12/06/17 Osteoporosis TDaP:  2018 Pneumonia vaccine(s): unsure Shingrix: completed Hep C testing: 07/12/15 Neg Screening Labs: done earlier today   reports that she has never smoked. She has never used smokeless tobacco. She reports current alcohol use of about 6.0 - 7.0 standard drinks of alcohol per week. She reports that she does not use drugs.  Past Medical History:  Diagnosis Date  . Abnormal Pap smear 11/24/11   ASCUS/AGUS/negative HR HPV  . Sciatic nerve pain     Past Surgical History:  Procedure Laterality Date  . AUGMENTATION MAMMAPLASTY Bilateral   . BREAST SURGERY Bilateral 08/2003   Saline implants  . TUBAL LIGATION  1988    Current Outpatient Medications  Medication Sig Dispense Refill  . Calcium Carbonate-Vit D-Min (CALCIUM 1200 PO) calcium    . Cholecalciferol (VITAMIN D) 2000 UNITS CAPS Take by mouth.    Marland Kitchen ibuprofen (ADVIL,MOTRIN) 200 MG tablet Take 200 mg by mouth every 6 (six) hours as needed for pain.    . nitrofurantoin (MACRODANTIN) 100 MG capsule TAKE 1 CAPSULE BY MOUTH ONCE DAILY AS NEEDED FOR  UTI  SUPPRESSION 30 capsule 0   No current facility-administered medications for this visit.    Family History  Problem Relation Age of Onset  . Cancer Father 96       Lung  . Breast cancer Mother 24    Review of Systems  All  other systems reviewed and are negative.   Exam:   BP 124/76 (BP Location: Right Arm, Patient Position: Sitting, Cuff Size: Normal)   Pulse 64   Temp (!) 96.3 F (35.7 C) (Temporal)   Ht 5' 5.75" (1.67 m)   Wt 126 lb 6.4 oz (57.3 kg)   LMP 04/26/2012 (Approximate)   BMI 20.56 kg/m   Height: 5' 5.75" (167 cm)  Ht Readings from Last 3 Encounters:  07/18/19 5' 5.75" (1.67 m)  12/24/17 5\' 6"  (1.676 m)  08/16/17 5\' 6"  (1.676 m)    General appearance: alert, cooperative and appears stated age Head: Normocephalic, without obvious abnormality, atraumatic Neck: no adenopathy, supple, symmetrical, trachea midline and thyroid normal to inspection and palpation Lungs: clear to auscultation bilaterally Breasts: normal appearance, no masses or tenderness Heart: regular rate and rhythm Abdomen: soft, non-tender; bowel sounds normal; no masses,  no organomegaly Extremities: extremities normal, atraumatic, no cyanosis or edema Skin: Skin color, texture, turgor normal. No rashes or lesions Lymph nodes: Cervical, supraclavicular, and axillary nodes normal. No abnormal inguinal nodes palpated Neurologic: Grossly normal    Pelvic: External genitalia:  no lesions              Urethra:  normal appearing urethra with no  masses, tenderness or lesions              Bartholins and Skenes: normal                 Vagina: normal appearing vagina with normal color and discharge, no lesions              Cervix: no lesions              Pap taken: No. Bimanual Exam:  Uterus:  normal size, contour, position, consistency, mobility, non-tender              Adnexa: normal adnexa and no mass, fullness, tenderness               Rectovaginal: Confirms               Anus:  normal sphincter tone, no lesions  Chaperone, Zenovia Jordan, CMA, was present for exam.  A:  Well Woman with normal exam PMP, no HRT H/o recurrent UTIs H/o AGUS/ASCUS pap 4/13 with negative evaluation  P:   Mammogram guidelines reviewed.   She is going to do this with her BMD in May.  pap smear neg with neg HR HPV 1/18 CMP, Lipids, CBC obtained today Cologuard order placed today BMD order placed RF for macrobid 100mg  po x 1 for prophylaxis  Return annually or prn

## 2019-07-19 LAB — COMPREHENSIVE METABOLIC PANEL
ALT: 17 IU/L (ref 0–32)
AST: 23 IU/L (ref 0–40)
Albumin/Globulin Ratio: 2.6 — ABNORMAL HIGH (ref 1.2–2.2)
Albumin: 5 g/dL — ABNORMAL HIGH (ref 3.8–4.8)
Alkaline Phosphatase: 93 IU/L (ref 39–117)
BUN/Creatinine Ratio: 14 (ref 12–28)
BUN: 11 mg/dL (ref 8–27)
Bilirubin Total: 0.6 mg/dL (ref 0.0–1.2)
CO2: 22 mmol/L (ref 20–29)
Calcium: 10.1 mg/dL (ref 8.7–10.3)
Chloride: 96 mmol/L (ref 96–106)
Creatinine, Ser: 0.79 mg/dL (ref 0.57–1.00)
GFR calc Af Amer: 93 mL/min/{1.73_m2} (ref >59)
GFR calc non Af Amer: 80 mL/min/{1.73_m2} (ref >59)
Globulin, Total: 1.9 g/dL (ref 1.5–4.5)
Glucose: 80 mg/dL (ref 65–99)
Potassium: 4.6 mmol/L (ref 3.5–5.2)
Sodium: 138 mmol/L (ref 134–144)
Total Protein: 6.9 g/dL (ref 6.0–8.5)

## 2019-07-19 LAB — LIPID PANEL
Chol/HDL Ratio: 2.3 ratio (ref 0.0–4.4)
Cholesterol, Total: 214 mg/dL — ABNORMAL HIGH (ref 100–199)
HDL: 94 mg/dL (ref >39)
LDL Chol Calc (NIH): 106 mg/dL — ABNORMAL HIGH (ref 0–99)
Triglycerides: 80 mg/dL (ref 0–149)
VLDL Cholesterol Cal: 14 mg/dL (ref 5–40)

## 2019-07-19 LAB — CBC
Hematocrit: 41.1 % (ref 34.0–46.6)
Hemoglobin: 14.1 g/dL (ref 11.1–15.9)
MCH: 31.6 pg (ref 26.6–33.0)
MCHC: 34.3 g/dL (ref 31.5–35.7)
MCV: 92 fL (ref 79–97)
Platelets: 302 10*3/uL (ref 150–450)
RBC: 4.46 x10E6/uL (ref 3.77–5.28)
RDW: 12.4 % (ref 11.7–15.4)
WBC: 3.8 10*3/uL (ref 3.4–10.8)

## 2019-08-21 ENCOUNTER — Encounter: Payer: Self-pay | Admitting: Obstetrics & Gynecology

## 2019-08-22 LAB — COLOGUARD: Cologuard: NEGATIVE

## 2019-08-25 LAB — COLOGUARD: COLOGUARD: NEGATIVE

## 2019-08-25 LAB — EXTERNAL GENERIC LAB PROCEDURE: COLOGUARD: NEGATIVE

## 2019-08-31 ENCOUNTER — Telehealth: Payer: Self-pay

## 2019-08-31 NOTE — Telephone Encounter (Signed)
Tried calling patient to notify of negative Cologuard results. No answer, per DPR, okay to leave a detailed message at [phone]. Detailed message with results on patient's voicemail. Advised patient if she has any questions to give our office a call back. Closing encounter.

## 2020-09-27 ENCOUNTER — Other Ambulatory Visit (HOSPITAL_BASED_OUTPATIENT_CLINIC_OR_DEPARTMENT_OTHER): Payer: Self-pay | Admitting: Obstetrics & Gynecology

## 2020-09-27 ENCOUNTER — Telehealth (HOSPITAL_BASED_OUTPATIENT_CLINIC_OR_DEPARTMENT_OTHER): Payer: Self-pay | Admitting: Obstetrics & Gynecology

## 2020-09-27 MED ORDER — NITROFURANTOIN MACROCRYSTAL 100 MG PO CAPS: ORAL_CAPSULE | ORAL | 1 refills | Status: DC

## 2020-09-27 NOTE — Telephone Encounter (Signed)
Called and left a message to please call the office to schedule her appointment with Dr.Miller.

## 2020-10-04 ENCOUNTER — Ambulatory Visit: Payer: BC Managed Care – PPO

## 2020-12-10 ENCOUNTER — Ambulatory Visit (HOSPITAL_BASED_OUTPATIENT_CLINIC_OR_DEPARTMENT_OTHER): Payer: BLUE CROSS/BLUE SHIELD | Admitting: Obstetrics & Gynecology

## 2020-12-11 ENCOUNTER — Ambulatory Visit (HOSPITAL_BASED_OUTPATIENT_CLINIC_OR_DEPARTMENT_OTHER): Payer: BLUE CROSS/BLUE SHIELD | Admitting: Obstetrics & Gynecology

## 2021-03-21 ENCOUNTER — Ambulatory Visit (HOSPITAL_BASED_OUTPATIENT_CLINIC_OR_DEPARTMENT_OTHER): Payer: BLUE CROSS/BLUE SHIELD | Admitting: Obstetrics & Gynecology

## 2021-04-03 ENCOUNTER — Ambulatory Visit (INDEPENDENT_AMBULATORY_CARE_PROVIDER_SITE_OTHER): Payer: No Typology Code available for payment source | Admitting: Obstetrics & Gynecology

## 2021-04-03 ENCOUNTER — Other Ambulatory Visit (HOSPITAL_COMMUNITY)
Admission: RE | Admit: 2021-04-03 | Discharge: 2021-04-03 | Disposition: A | Discharge status: Home / Self Care | Payer: No Typology Code available for payment source | Source: Ambulatory Visit | Attending: Obstetrics & Gynecology | Admitting: Obstetrics & Gynecology

## 2021-04-03 ENCOUNTER — Other Ambulatory Visit: Payer: Self-pay

## 2021-04-03 ENCOUNTER — Encounter (HOSPITAL_BASED_OUTPATIENT_CLINIC_OR_DEPARTMENT_OTHER): Payer: Self-pay | Admitting: Obstetrics & Gynecology

## 2021-04-03 VITALS — BP 134/88 | HR 63 | Ht 65.5 in | Wt 126.0 lb

## 2021-04-03 DIAGNOSIS — Z1231 Encounter for screening mammogram for malignant neoplasm of breast: Secondary | ICD-10-CM

## 2021-04-03 DIAGNOSIS — M810 Age-related osteoporosis without current pathological fracture: Secondary | ICD-10-CM | POA: Diagnosis not present

## 2021-04-03 DIAGNOSIS — Z01419 Encounter for gynecological examination (general) (routine) without abnormal findings: Secondary | ICD-10-CM | POA: Diagnosis not present

## 2021-04-03 DIAGNOSIS — Z124 Encounter for screening for malignant neoplasm of cervix: Secondary | ICD-10-CM | POA: Insufficient documentation

## 2021-04-03 DIAGNOSIS — N390 Urinary tract infection, site not specified: Secondary | ICD-10-CM | POA: Diagnosis not present

## 2021-04-03 NOTE — Progress Notes (Signed)
65 y.o. [id number] Married White or Caucasian female here for annual exam.  Doing well.  Went to [place] this summer.  Has been to the lake or beach.  Three of four children are in the area.  This is wonderful.    Denies vaginal bleeding.    Patient's last menstrual period was 04/26/2012 (approximate).          Sexually active: Yes.    The current method of family planning is post menopausal status.    Exercising: Yes.    Smoker:  no  Health Maintenance: Pap:  08/04/2016 History of abnormal Pap:  ASCUS MMG:  12/06/2017 Negative Colonoscopy:  08/25/2008; completed cologuard 2021 BMD:   12/06/2017 Osteoporosis TDaP:  2018 Shingrix:   completed Hep C testing: 2016 Screening Labs: 06/2019   reports that she has never smoked. She has never used smokeless tobacco. She reports current alcohol use of about 6.0 - 7.0 standard drinks per week. She reports that she does not use drugs.  Past Medical History:  Diagnosis Date   Abnormal Pap smear 11/24/11   ASCUS/AGUS/negative HR HPV   Sciatic nerve pain     Past Surgical History:  Procedure Laterality Date   AUGMENTATION MAMMAPLASTY Bilateral    BREAST SURGERY Bilateral 08/2003   Saline implants   TUBAL LIGATION  1988    Current Outpatient Medications  Medication Sig Dispense Refill   Calcium Carbonate-Vit D-Min (CALCIUM 1200 PO) calcium     Cholecalciferol (VITAMIN D) 2000 UNITS CAPS Take by mouth.     ibuprofen (ADVIL,MOTRIN) 200 MG tablet Take 200 mg by mouth every 6 (six) hours as needed for pain.     nitrofurantoin (MACRODANTIN) 100 MG capsule Take 1 tab po for UTI prophylaxis 30 capsule 1   No current facility-administered medications for this visit.    Family History  Problem Relation Age of Onset   Cancer Father 38       Lung   Breast cancer Mother 94    Review of Systems  All other systems reviewed and are negative.  Exam:   BP 134/88 (BP Location: Right Arm, Patient Position: Sitting, Cuff Size: Small)   Pulse 63    Ht 5' 5.5" (1.664 m)   Wt 126 lb (57.2 kg)   LMP 04/26/2012 (Approximate)   BMI 20.65 kg/m   Height: 5' 5.5" (166.4 cm)  General appearance: alert, cooperative and appears stated age Head: Normocephalic, without obvious abnormality, atraumatic Neck: no adenopathy, supple, symmetrical, trachea midline and thyroid normal to inspection and palpation Lungs: clear to auscultation bilaterally Breasts: normal appearance, no masses or tenderness, bilateral implants present Heart: regular rate and rhythm Abdomen: soft, non-tender; bowel sounds normal; no masses,  no organomegaly Extremities: extremities normal, atraumatic, no cyanosis or edema Skin: Skin color, texture, turgor normal. No rashes or lesions Lymph nodes: Cervical, supraclavicular, and axillary nodes normal. No abnormal inguinal nodes palpated Neurologic: Grossly normal  Pelvic: External genitalia:  no lesions              Urethra:  normal appearing urethra with no masses, tenderness or lesions              Bartholins and Skenes: normal                 Vagina: normal appearing vagina with normal color and no discharge, no lesions              Cervix: no lesions  Pap taken: Yes.   Bimanual Exam:  Uterus:  normal size, contour, position, consistency, mobility, non-tender              Adnexa: normal adnexa and no mass, fullness, tenderness               Rectovaginal: Confirms               Anus:  normal sphincter tone, no lesions  Chaperone, Ina Homes, CMA, was present for exam.  Assessment/Plan: 1. Well woman exam with routine gynecological exam - Pap smear obtained today - Mammogram order placed as it overdue - Colonoscopy done 2010, cologuard 2021 - Bone mineral density ordered - lab work not obtained today - vaccines reviewed/updated  2. Age-related osteoporosis without current pathological fracture - DG BONE DENSITY (DXA); Future  3. Recurrent UTI - does not need rx for this at this time

## 2021-04-06 DIAGNOSIS — M810 Age-related osteoporosis without current pathological fracture: Secondary | ICD-10-CM | POA: Insufficient documentation

## 2021-04-06 DIAGNOSIS — N390 Urinary tract infection, site not specified: Secondary | ICD-10-CM | POA: Insufficient documentation

## 2021-04-08 ENCOUNTER — Other Ambulatory Visit: Payer: Self-pay

## 2021-04-08 ENCOUNTER — Ambulatory Visit (HOSPITAL_BASED_OUTPATIENT_CLINIC_OR_DEPARTMENT_OTHER)
Admission: RE | Admit: 2021-04-08 | Discharge: 2021-04-08 | Disposition: A | Discharge status: Home / Self Care | Payer: No Typology Code available for payment source | Source: Ambulatory Visit | Attending: Obstetrics & Gynecology | Admitting: Obstetrics & Gynecology

## 2021-04-08 DIAGNOSIS — Z1231 Encounter for screening mammogram for malignant neoplasm of breast: Secondary | ICD-10-CM

## 2021-04-10 LAB — CYTOLOGY - PAP
Comment: NEGATIVE
Diagnosis: UNDETERMINED — AB
High risk HPV: NEGATIVE

## 2021-04-15 ENCOUNTER — Encounter (HOSPITAL_BASED_OUTPATIENT_CLINIC_OR_DEPARTMENT_OTHER): Payer: Self-pay

## 2021-07-10 ENCOUNTER — Other Ambulatory Visit (HOSPITAL_BASED_OUTPATIENT_CLINIC_OR_DEPARTMENT_OTHER): Payer: Self-pay | Admitting: Obstetrics & Gynecology

## 2022-02-25 ENCOUNTER — Other Ambulatory Visit (HOSPITAL_BASED_OUTPATIENT_CLINIC_OR_DEPARTMENT_OTHER): Payer: Self-pay | Admitting: Obstetrics & Gynecology

## 2022-02-25 NOTE — Telephone Encounter (Signed)
RF for macrobid completed but UTI prophylaxis.  Does not have appt for the fall.  Due after 9/22, I think,

## 2022-07-16 ENCOUNTER — Ambulatory Visit (HOSPITAL_BASED_OUTPATIENT_CLINIC_OR_DEPARTMENT_OTHER): Payer: No Typology Code available for payment source | Admitting: Obstetrics & Gynecology

## 2022-09-22 ENCOUNTER — Ambulatory Visit (HOSPITAL_BASED_OUTPATIENT_CLINIC_OR_DEPARTMENT_OTHER): Payer: No Typology Code available for payment source | Admitting: Obstetrics & Gynecology

## 2022-12-27 IMAGING — MG DIGITAL SCREENING BREAST BILAT IMPLANT W/ TOMO W/ CAD
9 of 14 series · 9 of 34 positions shown · non-contrast
Comparison: Previous exam(s).

CLINICAL DATA: Screening.

EXAM:
DIGITAL SCREENING BILATERAL MAMMOGRAM WITH IMPLANTS, CAD AND
TOMOSYNTHESIS
TECHNIQUE: Bilateral screening digital craniocaudal and mediolateral oblique
mammograms were obtained. Bilateral screening digital breast
tomosynthesis was performed. The images were evaluated with
computer-aided detection. Standard and/or implant displaced views
were performed.

[R MLO]
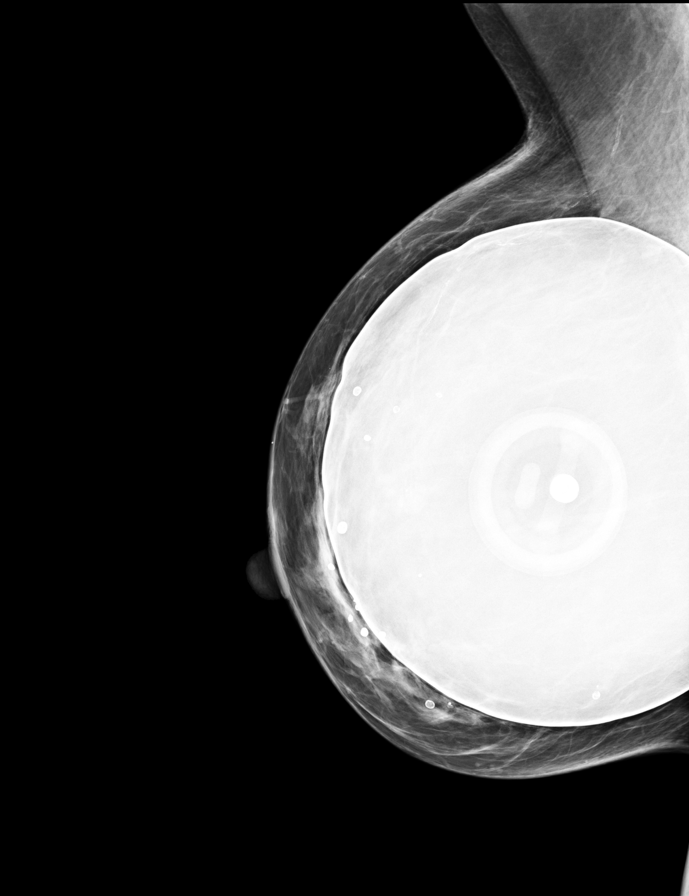

[L CC]
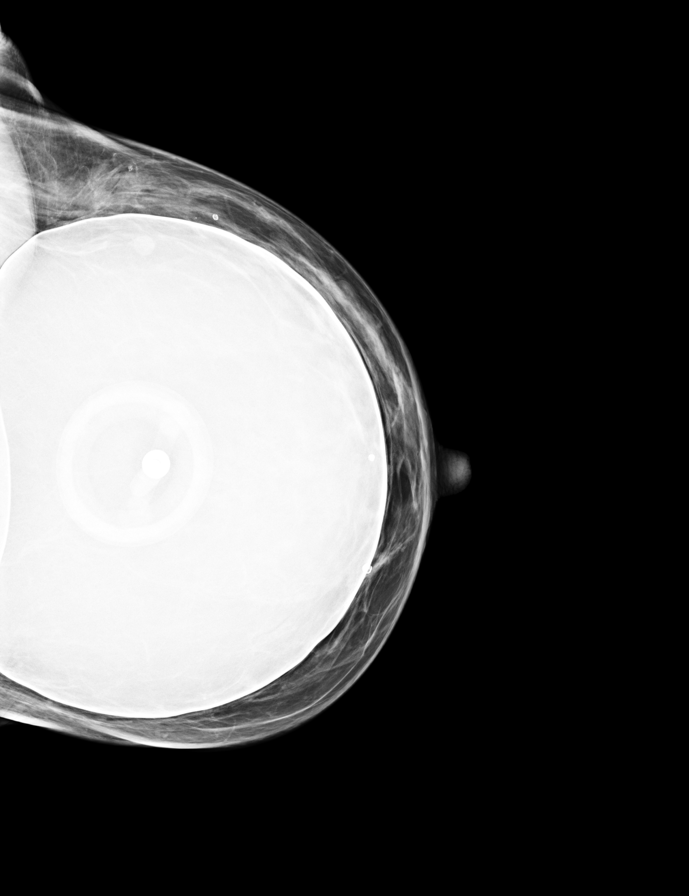

[R CC]
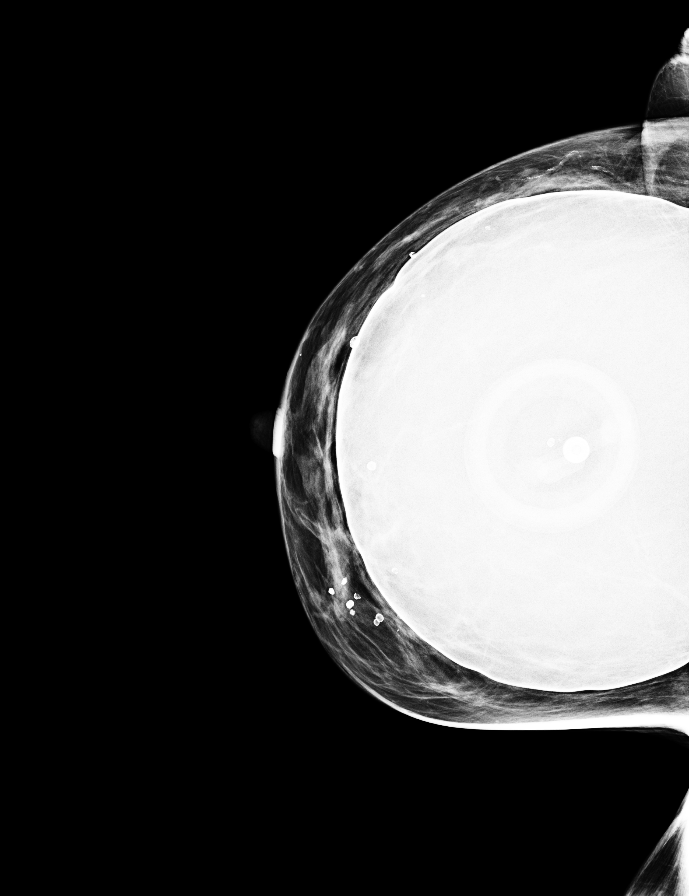

[L MLO]
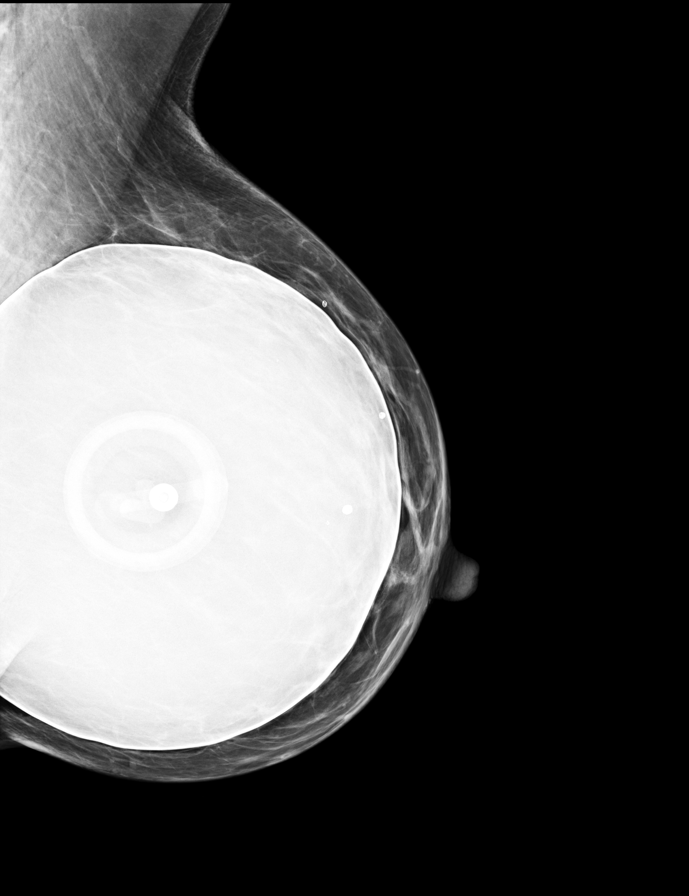

[R CC synth-2D]
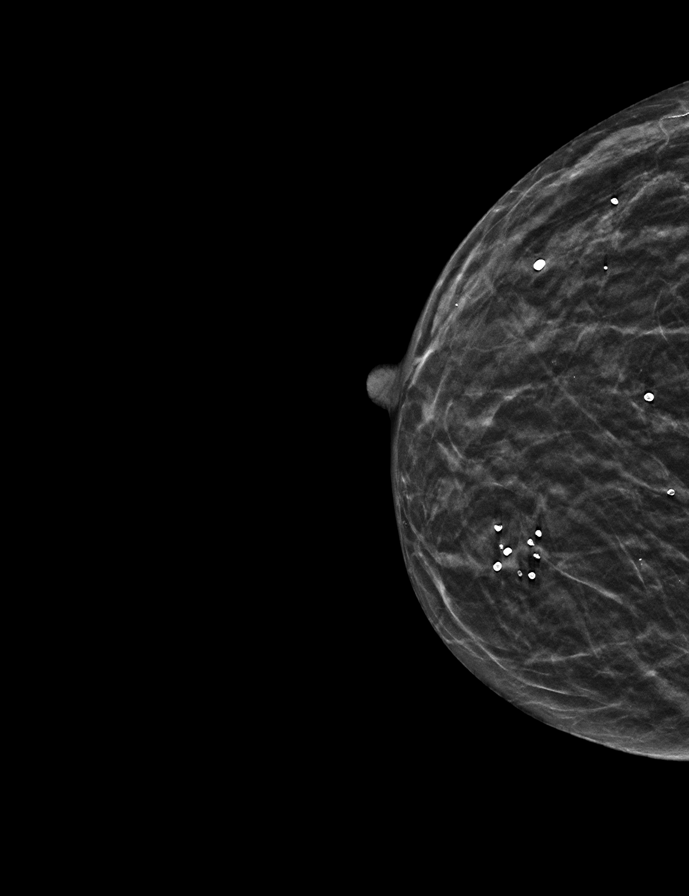

[R MLO synth-2D (1 of 2)]
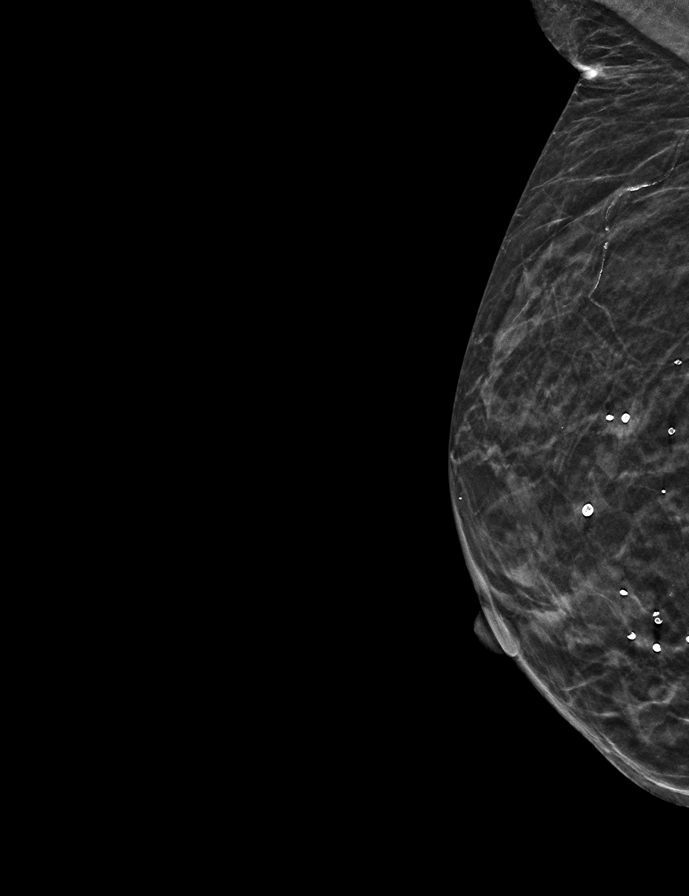

[L CC synth-2D]
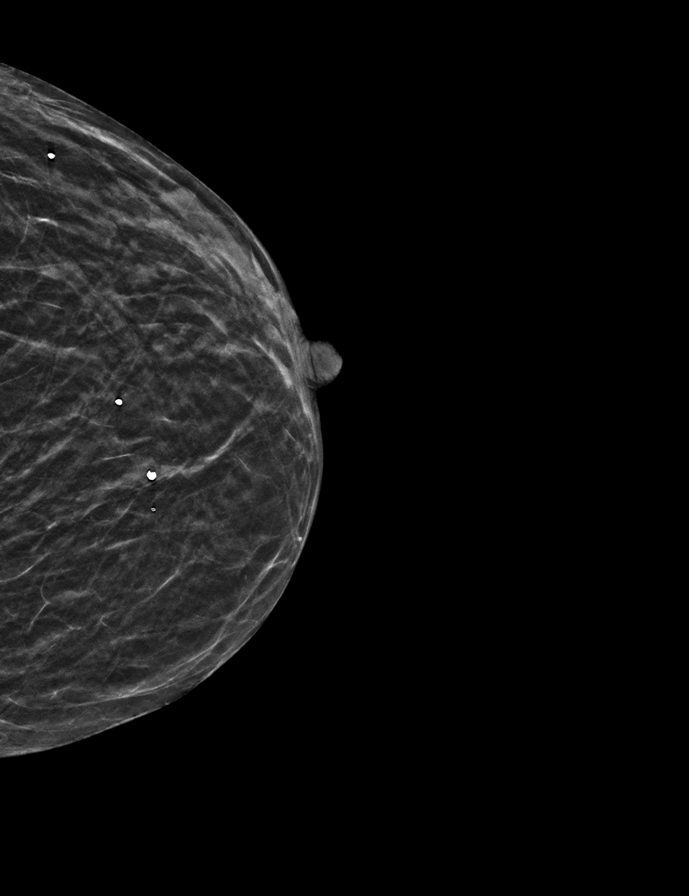

[R MLO synth-2D (2 of 2)]
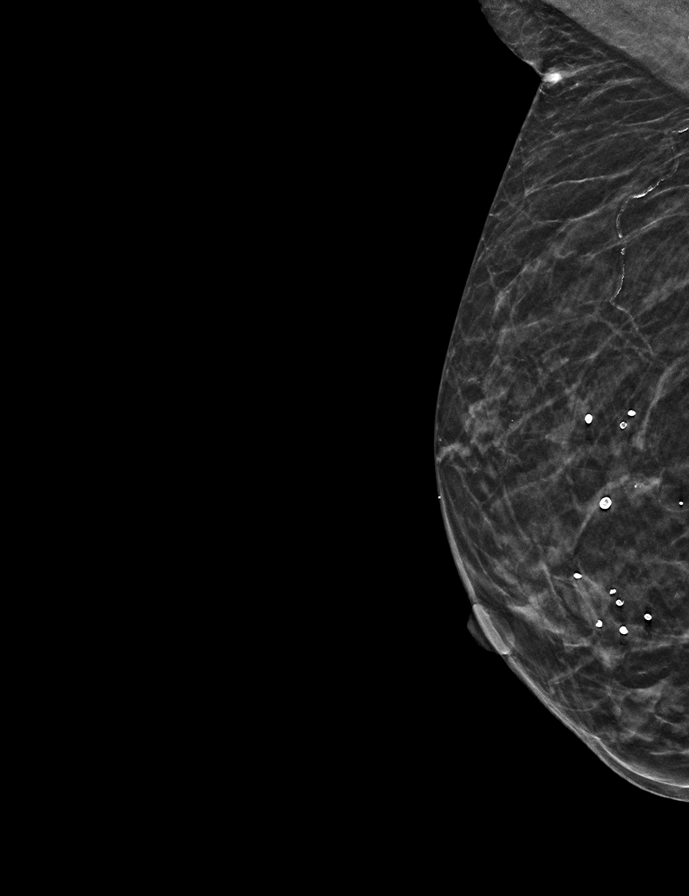

[L MLO synth-2D]
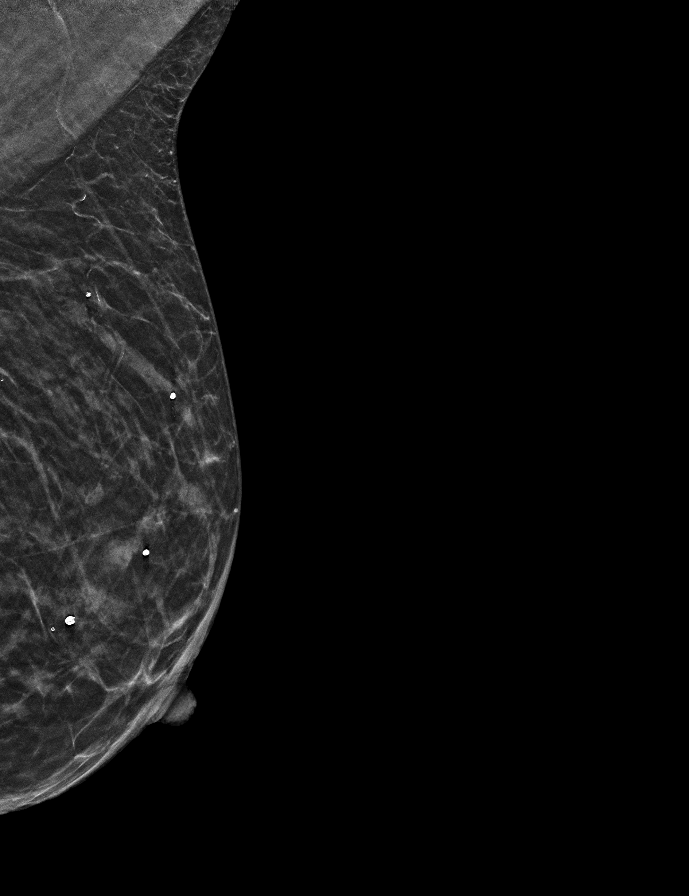

[9 of 34 positions shown; findings below may reference images not displayed]

ACR Breast Density Category b: There are scattered areas of
fibroglandular density.
FINDINGS: The patient has retropectoral implants. There are no findings
suspicious for malignancy.
IMPRESSION: No mammographic evidence of malignancy. A result letter of this
screening mammogram will be mailed directly to the patient.

RECOMMENDATION:
Screening mammogram in one year. (Code:SE-S-JMG)

BI-RADS CATEGORY  1:  Negative.

## 2023-01-18 ENCOUNTER — Ambulatory Visit (HOSPITAL_BASED_OUTPATIENT_CLINIC_OR_DEPARTMENT_OTHER): Payer: No Typology Code available for payment source | Admitting: Obstetrics & Gynecology

## 2023-02-01 ENCOUNTER — Other Ambulatory Visit (HOSPITAL_BASED_OUTPATIENT_CLINIC_OR_DEPARTMENT_OTHER): Payer: Self-pay | Admitting: Obstetrics & Gynecology

## 2023-02-01 NOTE — Telephone Encounter (Signed)
Pt is scheduled for her annual in the fall when it is due

## 2023-06-03 ENCOUNTER — Ambulatory Visit (HOSPITAL_BASED_OUTPATIENT_CLINIC_OR_DEPARTMENT_OTHER)
Admission: RE | Admit: 2023-06-03 | Discharge: 2023-06-03 | Disposition: A | Discharge status: Home / Self Care | Payer: Medicare HMO | Source: Ambulatory Visit | Attending: Obstetrics & Gynecology | Admitting: Obstetrics & Gynecology

## 2023-06-03 ENCOUNTER — Other Ambulatory Visit (HOSPITAL_BASED_OUTPATIENT_CLINIC_OR_DEPARTMENT_OTHER): Payer: Self-pay | Admitting: Obstetrics & Gynecology

## 2023-06-03 ENCOUNTER — Ambulatory Visit (HOSPITAL_BASED_OUTPATIENT_CLINIC_OR_DEPARTMENT_OTHER): Payer: Medicare HMO | Admitting: Obstetrics & Gynecology

## 2023-06-03 ENCOUNTER — Encounter (HOSPITAL_BASED_OUTPATIENT_CLINIC_OR_DEPARTMENT_OTHER): Payer: Self-pay | Admitting: Obstetrics & Gynecology

## 2023-06-03 ENCOUNTER — Encounter (HOSPITAL_BASED_OUTPATIENT_CLINIC_OR_DEPARTMENT_OTHER): Payer: Self-pay | Admitting: Radiology

## 2023-06-03 VITALS — BP 125/90 | HR 75 | Ht 66.5 in | Wt 126.2 lb

## 2023-06-03 DIAGNOSIS — M810 Age-related osteoporosis without current pathological fracture: Secondary | ICD-10-CM

## 2023-06-03 DIAGNOSIS — Z1231 Encounter for screening mammogram for malignant neoplasm of breast: Secondary | ICD-10-CM

## 2023-06-03 DIAGNOSIS — N390 Urinary tract infection, site not specified: Secondary | ICD-10-CM | POA: Diagnosis not present

## 2023-06-03 DIAGNOSIS — E041 Nontoxic single thyroid nodule: Secondary | ICD-10-CM

## 2023-06-03 DIAGNOSIS — Z13220 Encounter for screening for lipoid disorders: Secondary | ICD-10-CM | POA: Diagnosis not present

## 2023-06-03 DIAGNOSIS — Z01419 Encounter for gynecological examination (general) (routine) without abnormal findings: Secondary | ICD-10-CM

## 2023-06-03 DIAGNOSIS — Z1329 Encounter for screening for other suspected endocrine disorder: Secondary | ICD-10-CM | POA: Diagnosis not present

## 2023-06-03 DIAGNOSIS — Z0000 Encounter for general adult medical examination without abnormal findings: Secondary | ICD-10-CM | POA: Diagnosis not present

## 2023-06-03 DIAGNOSIS — Z0001 Encounter for general adult medical examination with abnormal findings: Secondary | ICD-10-CM | POA: Diagnosis not present

## 2023-06-03 MED ORDER — NITROFURANTOIN MACROCRYSTAL 100 MG PO CAPS: ORAL_CAPSULE | ORAL | 1 refills | Status: DC

## 2023-06-03 NOTE — Patient Instructions (Signed)
Call [phone] to schedule an appointment at [facility].

## 2023-06-03 NOTE — Progress Notes (Signed)
67 y.o. [id number] Married White or Caucasian female here for breast and pelvic exam.  Doing well.  Denies vaginal bleeding.  Went to [place] in October.  Nine grandchildren went on this trip (those in [place]).  Has two additional in [place].    Saw PCP, Sandra Young this morning.  Blood work penidng.    Patient's last menstrual period was 04/26/2012 (approximate).          Sexually active: Yes.    H/O STD:  no  Health Maintenance: PCP: Sandra Young.  Did blood work earlier today.   Vaccines are up to date:  reviewed today Colonoscopy:  08/25/2008.  Cologuard ordered.   MMG:  done today BMD:  12/06/2017 Osteoporosis Last pap smear:  09/0/2022 ASC-US with neg HR HPV, 08/04/2016 neg with neg HR HPV   Past Medical History:  Diagnosis Date   Abnormal Pap smear 11/24/11   ASCUS/AGUS/negative HR HPV   Sciatic nerve pain     Past Surgical History:  Procedure Laterality Date   AUGMENTATION MAMMAPLASTY Bilateral    BREAST SURGERY Bilateral 08/2003   Saline implants   TUBAL LIGATION  1988    Current Outpatient Medications  Medication Sig Dispense Refill   Calcium Carbonate-Vit D-Min (CALCIUM 1200 PO) calcium     Cholecalciferol (VITAMIN D) 2000 UNITS CAPS Take by mouth.     ibuprofen (ADVIL,MOTRIN) 200 MG tablet Take 200 mg by mouth every 6 (six) hours as needed for pain.     nitrofurantoin (MACRODANTIN) 100 MG capsule TAKE 1 CAPSULE BY MOUTH AS DIRECTED FOR  UTI  PROPHYLAXIS 30 capsule 0   No current facility-administered medications for this visit.    Family History  Problem Relation Age of Onset   Cancer Father 56       Lung   Breast cancer Mother 49    Review of Systems  Constitutional: Negative.   Genitourinary: Negative.     Exam:   BP (!) 125/90 (BP Location: Right Arm, Patient Position: Sitting, Cuff Size: Normal)   Pulse 75   Ht 5' 6.5" (1.689 m)   Wt 126 lb 3.2 oz (57.2 kg)   LMP 04/26/2012 (Approximate)   BMI 20.06 kg/m   Height: 5' 6.5" (168.9  cm)  General appearance: alert, cooperative and appears stated age Breasts: normal appearance, no masses or tenderness, bilateral implants Abdomen: soft, non-tender; bowel sounds normal; no masses,  no organomegaly Lymph nodes: Cervical, supraclavicular, and axillary nodes normal.  No abnormal inguinal nodes palpated Neurologic: Grossly normal  Pelvic: External genitalia:  no lesions              Urethra:  normal appearing urethra with no masses, tenderness or lesions              Bartholins and Skenes: normal                 Vagina: normal appearing vagina with atrophic changes and no discharge, no lesions              Cervix: no lesions              Pap taken: No. Bimanual Exam:  Uterus:  normal size, contour, position, consistency, mobility, non-tender              Adnexa: normal adnexa and no mass, fullness, tenderness               Rectovaginal: Confirms  Anus:  normal sphincter tone, no lesions  Chaperone, Sandra Young, CMA, was present for exam.  Assessment/Plan: 1. Encntr for gyn exam (general) (routine) w/o abn findings - Pap smear ASCUS with neg HR HPV 2022.  Will repeat next year. - Mammogram done today - Colonoscopy declined.  Last done 2010.  Cologuard ordered by PCP - Bone mineral density ordered.   - lab work done with PCP, Sandra Young today - vaccines reviewed/updated  2. Age-related osteoporosis without current pathological fracture - pt is taking calcium with Vit D. Dosing discussed.  Will updated dexa scan this year. - DG BONE DENSITY (DXA); Future  3. Recurrent UTI - uses for prevention and desires RF to pharmacy to be held when needed later this year - nitrofurantoin (MACRODANTIN) 100 MG capsule; TAKE 1 CAPSULE BY MOUTH AS DIRECTED FOR  UTI  PROPHYLAXIS  Dispense: 30 capsule; Refill: 1  4. Cystic thyroid nodule - imaging done 2018.  Criteria for biopsy and follow up not med.

## 2023-06-14 DIAGNOSIS — Z1212 Encounter for screening for malignant neoplasm of rectum: Secondary | ICD-10-CM | POA: Diagnosis not present

## 2023-06-14 DIAGNOSIS — Z1211 Encounter for screening for malignant neoplasm of colon: Secondary | ICD-10-CM | POA: Diagnosis not present

## 2023-06-22 LAB — COLOGUARD: COLOGUARD: NEGATIVE

## 2023-06-22 LAB — EXTERNAL GENERIC LAB PROCEDURE: COLOGUARD: NEGATIVE

## 2023-06-29 ENCOUNTER — Other Ambulatory Visit (HOSPITAL_BASED_OUTPATIENT_CLINIC_OR_DEPARTMENT_OTHER): Payer: No Typology Code available for payment source

## 2023-10-29 ENCOUNTER — Other Ambulatory Visit (HOSPITAL_BASED_OUTPATIENT_CLINIC_OR_DEPARTMENT_OTHER): Payer: Self-pay | Admitting: Obstetrics & Gynecology

## 2023-10-29 DIAGNOSIS — N390 Urinary tract infection, site not specified: Secondary | ICD-10-CM

## 2023-12-22 ENCOUNTER — Other Ambulatory Visit (HOSPITAL_BASED_OUTPATIENT_CLINIC_OR_DEPARTMENT_OTHER): Payer: Self-pay | Admitting: Obstetrics & Gynecology

## 2023-12-22 DIAGNOSIS — N390 Urinary tract infection, site not specified: Secondary | ICD-10-CM

## 2023-12-22 NOTE — Telephone Encounter (Signed)
 Tried calling patient without success. Would like to know if patient requested this medication?

## 2024-06-15 ENCOUNTER — Encounter (HOSPITAL_BASED_OUTPATIENT_CLINIC_OR_DEPARTMENT_OTHER): Payer: Self-pay

## 2024-06-15 ENCOUNTER — Other Ambulatory Visit (HOSPITAL_BASED_OUTPATIENT_CLINIC_OR_DEPARTMENT_OTHER): Payer: Self-pay

## 2024-06-15 ENCOUNTER — Telehealth (HOSPITAL_BASED_OUTPATIENT_CLINIC_OR_DEPARTMENT_OTHER): Payer: Self-pay

## 2024-06-15 DIAGNOSIS — Z01419 Encounter for gynecological examination (general) (routine) without abnormal findings: Secondary | ICD-10-CM

## 2024-06-15 NOTE — Telephone Encounter (Signed)
 Patient called and stated that she needs an order put in so she can get a mammogram; she tried to schedule one on her own but, was told because she was having breast pain, that she should contact her doctor for an order. Patient would like a call when the order is put in or a Mychart message.

## 2024-06-15 NOTE — Telephone Encounter (Signed)
 Order placed and mychart message sent to patient  Morna LOISE Quale, RN

## 2024-06-20 ENCOUNTER — Encounter (HOSPITAL_BASED_OUTPATIENT_CLINIC_OR_DEPARTMENT_OTHER): Payer: Self-pay

## 2024-06-20 ENCOUNTER — Ambulatory Visit (HOSPITAL_BASED_OUTPATIENT_CLINIC_OR_DEPARTMENT_OTHER)
Admission: RE | Admit: 2024-06-20 | Discharge: 2024-06-20 | Disposition: A | Discharge status: Home / Self Care | Source: Ambulatory Visit | Attending: Obstetrics & Gynecology | Admitting: Obstetrics & Gynecology

## 2024-06-20 DIAGNOSIS — Z1231 Encounter for screening mammogram for malignant neoplasm of breast: Secondary | ICD-10-CM | POA: Insufficient documentation

## 2024-06-20 DIAGNOSIS — Z01419 Encounter for gynecological examination (general) (routine) without abnormal findings: Secondary | ICD-10-CM

## 2024-07-24 ENCOUNTER — Other Ambulatory Visit (HOSPITAL_BASED_OUTPATIENT_CLINIC_OR_DEPARTMENT_OTHER): Payer: Self-pay | Admitting: Certified Nurse Midwife

## 2024-07-24 DIAGNOSIS — N390 Urinary tract infection, site not specified: Secondary | ICD-10-CM
